# Patient Record
Sex: Female | Born: 1995 | Hispanic: No | Marital: Single | Smoking: Never smoker
Health system: Southern US, Community
[De-identification: ages and names within clinical notes are randomized; demographics above are authoritative.]

## PROBLEM LIST (undated history)

## (undated) ENCOUNTER — Inpatient Hospital Stay (HOSPITAL_COMMUNITY): Payer: Self-pay

## (undated) DIAGNOSIS — Z91410 Personal history of adult physical and sexual abuse: Secondary | ICD-10-CM

## (undated) DIAGNOSIS — R112 Nausea with vomiting, unspecified: Secondary | ICD-10-CM

## (undated) DIAGNOSIS — A609 Anogenital herpesviral infection, unspecified: Secondary | ICD-10-CM

## (undated) DIAGNOSIS — Z62810 Personal history of physical and sexual abuse in childhood: Secondary | ICD-10-CM

## (undated) DIAGNOSIS — F410 Panic disorder [episodic paroxysmal anxiety] without agoraphobia: Secondary | ICD-10-CM

## (undated) DIAGNOSIS — Z9889 Other specified postprocedural states: Secondary | ICD-10-CM

## (undated) HISTORY — PX: TONSILLECTOMY: SUR1361

## (undated) HISTORY — PX: APPENDECTOMY: SHX54

---

## 2015-07-29 ENCOUNTER — Encounter (HOSPITAL_COMMUNITY): Payer: Self-pay | Admitting: Emergency Medicine

## 2015-07-29 ENCOUNTER — Emergency Department (HOSPITAL_COMMUNITY)
Admission: EM | Admit: 2015-07-29 | Discharge: 2015-07-29 | Disposition: A | Discharge status: Home / Self Care | Payer: Medicaid Other | Attending: Emergency Medicine | Admitting: Emergency Medicine

## 2015-07-29 DIAGNOSIS — F10920 Alcohol use, unspecified with intoxication, uncomplicated: Secondary | ICD-10-CM

## 2015-07-29 DIAGNOSIS — F10120 Alcohol abuse with intoxication, uncomplicated: Secondary | ICD-10-CM | POA: Diagnosis not present

## 2015-07-29 DIAGNOSIS — Z87891 Personal history of nicotine dependence: Secondary | ICD-10-CM | POA: Insufficient documentation

## 2015-07-29 DIAGNOSIS — F10129 Alcohol abuse with intoxication, unspecified: Secondary | ICD-10-CM | POA: Diagnosis present

## 2015-07-29 LAB — COMPREHENSIVE METABOLIC PANEL
ALT: 19 U/L (ref 14–54)
ANION GAP: 14 (ref 5–15)
AST: 24 U/L (ref 15–41)
Albumin: 4.9 g/dL (ref 3.5–5.0)
Alkaline Phosphatase: 86 U/L (ref 38–126)
BILIRUBIN TOTAL: 0.9 mg/dL (ref 0.3–1.2)
BUN: 6 mg/dL (ref 6–20)
CHLORIDE: 108 mmol/L (ref 101–111)
CO2: 20 mmol/L — ABNORMAL LOW (ref 22–32)
Calcium: 9.5 mg/dL (ref 8.9–10.3)
Creatinine, Ser: 0.84 mg/dL (ref 0.44–1.00)
GFR calc Af Amer: >60 mL/min (ref >60)
Glucose, Bld: 96 mg/dL (ref 65–99)
POTASSIUM: 3.2 mmol/L — AB (ref 3.5–5.1)
Sodium: 142 mmol/L (ref 135–145)
TOTAL PROTEIN: 7.1 g/dL (ref 6.5–8.1)

## 2015-07-29 LAB — CBC
HEMATOCRIT: 42 % (ref 36.0–46.0)
HEMOGLOBIN: 14.7 g/dL (ref 12.0–15.0)
MCH: 30.6 pg (ref 26.0–34.0)
MCHC: 35 g/dL (ref 30.0–36.0)
MCV: 87.3 fL (ref 78.0–100.0)
Platelets: 323 10*3/uL (ref 150–400)
RBC: 4.81 MIL/uL (ref 3.87–5.11)
RDW: 12.1 % (ref 11.5–15.5)
WBC: 9.6 10*3/uL (ref 4.0–10.5)

## 2015-07-29 LAB — ETHANOL: ALCOHOL ETHYL (B): 225 mg/dL — AB (ref <5)

## 2015-07-29 LAB — I-STAT BETA HCG BLOOD, ED (MC, WL, AP ONLY)

## 2015-07-29 MED ORDER — ONDANSETRON HCL 4 MG/2ML IJ SOLN
4.0000 mg | Freq: Once | INTRAMUSCULAR | Status: AC
  Administered 2015-07-29: 4 mg via INTRAVENOUS
  Filled 2015-07-29: qty 2

## 2015-07-29 MED ORDER — LORAZEPAM 2 MG/ML IJ SOLN
0.5000 mg | Freq: Once | INTRAMUSCULAR | Status: AC
  Administered 2015-07-29: 0.5 mg via INTRAVENOUS
  Filled 2015-07-29: qty 1

## 2015-07-29 MED ORDER — ONDANSETRON 4 MG PO TBDP: 4.0000 mg | ORAL_TABLET | Freq: Three times a day (TID) | ORAL | Status: DC | PRN

## 2015-07-29 MED ORDER — SODIUM CHLORIDE 0.9 % IV BOLUS (SEPSIS)
1000.0000 mL | Freq: Once | INTRAVENOUS | Status: AC
Start: 2015-07-29 — End: 2015-07-29
  Administered 2015-07-29: 1000 mL via INTRAVENOUS
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
## 2015-07-29 NOTE — ED Notes (Signed)
Pt not alert enough for Fluid challenge.

## 2015-07-29 NOTE — ED Notes (Signed)
Ativan 1.5mg  wasted with Madalyn RobJennifer Gloster RN in sharps container.

## 2015-07-29 NOTE — ED Notes (Signed)
Pt awake at this time. Pt is a&o X4. Warm blanket provided, MD aware. Pt to call ride for discharge.

## 2015-07-29 NOTE — ED Notes (Signed)
IV not patent, has been removed by patient. Pt somewhat alert at this time. Unable to converse with patient at this time.

## 2015-07-29 NOTE — ED Provider Notes (Signed)
TIME SEEN: 5:45 AM  CHIEF COMPLAINT: Alcohol intoxication  HPI: Patient is a 20 year old female with no significant past medical history who presents the emergency department for intoxication. Her friend at bedside states that they could not get her to stop "dry heaving" and that is why they brought her to the hospital. Patient is upset, crying, hyperventilating. They report that she had multiple shots of tequila tonight. No drug use. No sign or history of trauma. Patient denies any pain currently.  ROS: See HPI Constitutional: no fever  Eyes: no drainage  ENT: no runny nose   Cardiovascular:  no chest pain  Resp: no SOB  GI: no vomiting GU: no dysuria Integumentary: no rash  Allergy: no hives  Musculoskeletal: no leg swelling  Neurological: no slurred speech ROS otherwise negative  PAST MEDICAL HISTORY/PAST SURGICAL HISTORY:  History reviewed. No pertinent past medical history.  MEDICATIONS:  Prior to Admission medications   Not on File    ALLERGIES:  No Known Allergies  SOCIAL HISTORY:  Social History  Substance Use Topics  . Smoking status: Former Games developermoker  . Smokeless tobacco: Not on file  . Alcohol Use: Yes    FAMILY HISTORY: No family history on file.  EXAM: BP 103/81 mmHg  Pulse 107  Temp(Src) 97.5 F (36.4 C) (Oral)  Ht 5\' 4"  (1.626 m)  Wt 110 lb (49.896 kg)  BMI 18.87 kg/m2  SpO2 100% CONSTITUTIONAL: Alert and oriented and responds appropriately to questions. Patient is disheveled, tearful, hyperventilating, appears intoxicated HEAD: Normocephalic, atraumatic EYES: Conjunctivae clear, PERRL ENT: normal nose; no rhinorrhea; moist mucous membranes NECK: Supple, no meningismus, no LAD  CARD: RRR; S1 and S2 appreciated; no murmurs, no clicks, no rubs, no gallops RESP: Normal chest excursion without splinting or tachypnea; breath sounds clear and equal bilaterally; no wheezes, no rhonchi, no rales, no hypoxia or respiratory distress, speaking full  sentences ABD/GI: Normal bowel sounds; non-distended; soft, non-tender, no rebound, no guarding, no peritoneal signs BACK:  The back appears normal and is non-tender to palpation, there is no CVA tenderness EXT: Normal ROM in all joints; non-tender to palpation; no edema; normal capillary refill; no cyanosis, no calf tenderness or swelling    SKIN: Normal color for age and race; warm; no rash NEURO: Moves all extremities equally, sensation to light touch intact diffusely, cranial nerves II through XII intact PSYCH: Patient is anxious, hyperventilating  MEDICAL DECISION MAKING: Patient here with alcohol intoxication. Labs ordered in triage are pending. Hemodynamically stable. We'll give Ativan for her anxiety. We'll give IV fluids, Zofran.  ED PROGRESS: Patient will need to sober up prior to being discharged home. Discussed this with her friend at bedside. We'll fluid challenge patient, ambulate. When she is clinically sober, will discharge home with sober driver. We'll discharge with prescription for Zofran to take as needed.   At this time, I do not feel there is any life-threatening condition present. I have reviewed and discussed all results (EKG, imaging, lab, urine as appropriate), exam findings with patient. I have reviewed nursing notes and appropriate previous records.  I feel the patient is safe to be discharged home without further emergent workup. Discussed usual and customary return precautions. Patient and family (if present) verbalize understanding and are comfortable with this plan.  Patient will follow-up with their primary care provider. If they do not have a primary care provider, information for follow-up has been provided to them. All questions have been answered.   '  Gladie Gravette N Johnatan Baskette, DO 07/29/15  0721 

## 2015-07-29 NOTE — Discharge Instructions (Signed)
Alcohol Intoxication Alcohol intoxication occurs when the amount of alcohol that a person has consumed impairs his or her ability to mentally and physically function. Alcohol directly impairs the normal chemical activity of the brain. Drinking large amounts of alcohol can lead to changes in mental function and behavior, and it can cause many physical effects that can be harmful.  Alcohol intoxication can range in severity from mild to very severe. Various factors can affect the level of intoxication that occurs, such as the person's age, gender, weight, frequency of alcohol consumption, and the presence of other medical conditions (such as diabetes, seizures, or heart conditions). Dangerous levels of alcohol intoxication may occur when people drink large amounts of alcohol in a short period (binge drinking). Alcohol can also be especially dangerous when combined with certain prescription medicines or "recreational" drugs. SIGNS AND SYMPTOMS Some common signs and symptoms of mild alcohol intoxication include:  Loss of coordination.  Changes in mood and behavior.  Impaired judgment.  Slurred speech. As alcohol intoxication progresses to more severe levels, other signs and symptoms will appear. These may include:  Vomiting.  Confusion and impaired memory.  Slowed breathing.  Seizures.  Loss of consciousness. DIAGNOSIS  Your health care provider will take a medical history and perform a physical exam. You will be asked about the amount and type of alcohol you have consumed. Blood tests will be done to measure the concentration of alcohol in your blood. In many places, your blood alcohol level must be lower than 80 mg/dL (0.86%0.08%) to legally drive. However, many dangerous effects of alcohol can occur at much lower levels.  TREATMENT  People with alcohol intoxication often do not require treatment. Most of the effects of alcohol intoxication are temporary, and they go away as the alcohol naturally  leaves the body. Your health care provider will monitor your condition until you are stable enough to go home. Fluids are sometimes given through an IV access tube to help prevent dehydration.  HOME CARE INSTRUCTIONS  Do not drive after drinking alcohol.  Stay hydrated. Drink enough water and fluids to keep your urine clear or pale yellow. Avoid caffeine.   Only take over-the-counter or prescription medicines as directed by your health care provider.  SEEK MEDICAL CARE IF:   You have persistent vomiting.   You do not feel better after a few days.  You have frequent alcohol intoxication. Your health care provider can help determine if you should see a substance use treatment counselor. SEEK IMMEDIATE MEDICAL CARE IF:   You become shaky or tremble when you try to stop drinking.   You shake uncontrollably (seizure).   You throw up (vomit) blood. This may be bright red or may look like black coffee grounds.   You have blood in your stool. This may be bright red or may appear as a black, tarry, bad smelling stool.   You become lightheaded or faint.  MAKE SURE YOU:   Understand these instructions.  Will watch your condition.  Will get help right away if you are not doing well or get worse.   This information is not intended to replace advice given to you by your health care provider. Make sure you discuss any questions you have with your health care provider.   Document Released: 10/15/2004 Document Revised: 09/07/2012 Document Reviewed: 06/10/2012 Elsevier Interactive Patient Education [employer]2016 Elsevier Inc.    To find a primary care or specialty doctor please call [phone] or [phone] to access "[facility] Find a  Doctor Service."  You may also go on the [facility] website at [email or web address]  There are also multiple [facility] and [facility] practices throughout the [place] that are frequently accepting new patients. You may find a clinic  that is close to your home and contact them.  [facility] -  [address] [place] [address] [phone]  [facility] in [place] (also locations in [place] and [place]) -  [address] [place] [address] [phone]  [facility] -  [address] [place] [address] [phone]

## 2015-07-29 NOTE — ED Notes (Signed)
Pt able to ambulate around room without difficulty.

## 2015-07-29 NOTE — ED Provider Notes (Signed)
11:08 AM Patient awake and alert, no new complaints. Patient voices embarrassment about her presentation last night. Patient is awaiting a ride home.  Rebecca Munchobert Demonte Dobratz, MD 07/29/15 1108

## 2016-01-20 HISTORY — PX: APPENDECTOMY: SHX54

## 2017-07-17 ENCOUNTER — Encounter (HOSPITAL_COMMUNITY): Payer: Self-pay

## 2017-08-12 LAB — OB RESULTS CONSOLE HEPATITIS B SURFACE ANTIGEN: Hepatitis B Surface Ag: NEGATIVE

## 2017-08-12 LAB — OB RESULTS CONSOLE ABO/RH: RH Type: POSITIVE

## 2017-08-12 LAB — OB RESULTS CONSOLE ANTIBODY SCREEN: Antibody Screen: NEGATIVE

## 2017-08-12 LAB — OB RESULTS CONSOLE RPR: RPR: NONREACTIVE

## 2017-08-12 LAB — OB RESULTS CONSOLE RUBELLA ANTIBODY, IGM: Rubella: IMMUNE

## 2017-08-12 LAB — OB RESULTS CONSOLE HIV ANTIBODY (ROUTINE TESTING): HIV: NONREACTIVE

## 2017-08-12 LAB — OB RESULTS CONSOLE GC/CHLAMYDIA: Gonorrhea: NEGATIVE

## 2017-08-13 ENCOUNTER — Other Ambulatory Visit (HOSPITAL_COMMUNITY): Payer: Self-pay | Admitting: Nurse Practitioner

## 2017-08-13 ENCOUNTER — Encounter (HOSPITAL_COMMUNITY): Payer: Self-pay

## 2017-08-13 DIAGNOSIS — Z3682 Encounter for antenatal screening for nuchal translucency: Secondary | ICD-10-CM

## 2017-08-13 DIAGNOSIS — Z3A13 13 weeks gestation of pregnancy: Secondary | ICD-10-CM

## 2017-08-18 ENCOUNTER — Encounter (HOSPITAL_COMMUNITY): Payer: Self-pay | Admitting: *Deleted

## 2017-08-20 ENCOUNTER — Ambulatory Visit (HOSPITAL_COMMUNITY)
Admission: RE | Admit: 2017-08-20 | Discharge: 2017-08-20 | Disposition: A | Discharge status: Home / Self Care | Payer: Medicaid Other | Source: Ambulatory Visit | Attending: Nurse Practitioner | Admitting: Nurse Practitioner

## 2017-08-20 ENCOUNTER — Encounter (HOSPITAL_COMMUNITY): Payer: Self-pay

## 2017-08-20 ENCOUNTER — Other Ambulatory Visit: Payer: Self-pay

## 2017-08-20 DIAGNOSIS — Z3A13 13 weeks gestation of pregnancy: Secondary | ICD-10-CM | POA: Insufficient documentation

## 2017-08-20 DIAGNOSIS — Z3682 Encounter for antenatal screening for nuchal translucency: Secondary | ICD-10-CM | POA: Insufficient documentation

## 2017-08-20 HISTORY — DX: Anogenital herpesviral infection, unspecified: A60.9

## 2017-08-20 HISTORY — DX: Personal history of adult physical and sexual abuse: Z91.410

## 2017-08-20 HISTORY — DX: Panic disorder (episodic paroxysmal anxiety): F41.0

## 2017-08-20 HISTORY — DX: Personal history of physical and sexual abuse in childhood: Z62.810

## 2017-09-01 ENCOUNTER — Other Ambulatory Visit: Payer: Self-pay

## 2017-09-09 LAB — OB RESULTS CONSOLE GBS: STREP GROUP B AG: POSITIVE

## 2017-09-16 ENCOUNTER — Other Ambulatory Visit: Payer: Self-pay | Admitting: Family Medicine

## 2017-09-16 DIAGNOSIS — O289 Unspecified abnormal findings on antenatal screening of mother: Secondary | ICD-10-CM

## 2017-10-01 ENCOUNTER — Other Ambulatory Visit: Payer: Self-pay

## 2017-10-01 ENCOUNTER — Ambulatory Visit (HOSPITAL_COMMUNITY)
Admission: RE | Admit: 2017-10-01 | Discharge: 2017-10-01 | Disposition: A | Discharge status: Home / Self Care | Payer: Medicaid Other | Source: Ambulatory Visit | Attending: Family Medicine | Admitting: Family Medicine

## 2017-10-01 ENCOUNTER — Encounter (HOSPITAL_COMMUNITY): Payer: Self-pay

## 2017-10-01 DIAGNOSIS — O289 Unspecified abnormal findings on antenatal screening of mother: Secondary | ICD-10-CM | POA: Diagnosis not present

## 2017-10-01 DIAGNOSIS — Z363 Encounter for antenatal screening for malformations: Secondary | ICD-10-CM | POA: Diagnosis present

## 2017-10-01 DIAGNOSIS — O99322 Drug use complicating pregnancy, second trimester: Secondary | ICD-10-CM | POA: Diagnosis not present

## 2017-10-01 DIAGNOSIS — F1210 Cannabis abuse, uncomplicated: Secondary | ICD-10-CM | POA: Diagnosis not present

## 2017-10-01 DIAGNOSIS — Z3A19 19 weeks gestation of pregnancy: Secondary | ICD-10-CM | POA: Diagnosis not present

## 2017-10-01 DIAGNOSIS — O99312 Alcohol use complicating pregnancy, second trimester: Secondary | ICD-10-CM | POA: Diagnosis not present

## 2017-10-04 ENCOUNTER — Other Ambulatory Visit (HOSPITAL_COMMUNITY): Payer: Self-pay | Admitting: *Deleted

## 2017-10-04 DIAGNOSIS — Z362 Encounter for other antenatal screening follow-up: Secondary | ICD-10-CM

## 2017-10-29 ENCOUNTER — Ambulatory Visit (HOSPITAL_COMMUNITY)
Admission: RE | Admit: 2017-10-29 | Discharge: 2017-10-29 | Disposition: A | Discharge status: Home / Self Care | Payer: Medicaid Other | Source: Ambulatory Visit | Attending: Family Medicine | Admitting: Family Medicine

## 2017-10-29 ENCOUNTER — Encounter (HOSPITAL_COMMUNITY): Payer: Self-pay

## 2017-10-29 DIAGNOSIS — O99312 Alcohol use complicating pregnancy, second trimester: Secondary | ICD-10-CM | POA: Diagnosis present

## 2017-10-29 DIAGNOSIS — Z362 Encounter for other antenatal screening follow-up: Secondary | ICD-10-CM | POA: Diagnosis not present

## 2017-10-29 DIAGNOSIS — O289 Unspecified abnormal findings on antenatal screening of mother: Secondary | ICD-10-CM | POA: Insufficient documentation

## 2017-10-29 DIAGNOSIS — Z3A23 23 weeks gestation of pregnancy: Secondary | ICD-10-CM | POA: Diagnosis not present

## 2017-11-01 ENCOUNTER — Other Ambulatory Visit (HOSPITAL_COMMUNITY): Payer: Self-pay | Admitting: *Deleted

## 2017-11-01 DIAGNOSIS — R772 Abnormality of alphafetoprotein: Secondary | ICD-10-CM

## 2017-12-10 ENCOUNTER — Ambulatory Visit (HOSPITAL_COMMUNITY)
Admission: RE | Admit: 2017-12-10 | Discharge: 2017-12-10 | Disposition: A | Discharge status: Home / Self Care | Payer: Medicaid Other | Source: Ambulatory Visit | Attending: Nurse Practitioner | Admitting: Nurse Practitioner

## 2017-12-10 ENCOUNTER — Encounter (HOSPITAL_COMMUNITY): Payer: Self-pay

## 2017-12-10 DIAGNOSIS — R772 Abnormality of alphafetoprotein: Secondary | ICD-10-CM | POA: Insufficient documentation

## 2017-12-10 DIAGNOSIS — O289 Unspecified abnormal findings on antenatal screening of mother: Secondary | ICD-10-CM | POA: Diagnosis not present

## 2017-12-10 DIAGNOSIS — Z362 Encounter for other antenatal screening follow-up: Secondary | ICD-10-CM | POA: Insufficient documentation

## 2017-12-10 DIAGNOSIS — O99312 Alcohol use complicating pregnancy, second trimester: Secondary | ICD-10-CM | POA: Diagnosis not present

## 2017-12-10 DIAGNOSIS — Z3A29 29 weeks gestation of pregnancy: Secondary | ICD-10-CM | POA: Diagnosis not present

## 2017-12-13 ENCOUNTER — Other Ambulatory Visit (HOSPITAL_COMMUNITY): Payer: Self-pay | Admitting: *Deleted

## 2017-12-13 DIAGNOSIS — Z362 Encounter for other antenatal screening follow-up: Secondary | ICD-10-CM

## 2018-01-14 ENCOUNTER — Ambulatory Visit (HOSPITAL_COMMUNITY)
Admission: RE | Admit: 2018-01-14 | Discharge: 2018-01-14 | Disposition: A | Discharge status: Home / Self Care | Payer: Medicaid Other | Source: Ambulatory Visit | Attending: Nurse Practitioner | Admitting: Nurse Practitioner

## 2018-01-14 ENCOUNTER — Encounter (HOSPITAL_COMMUNITY): Payer: Self-pay

## 2018-01-14 DIAGNOSIS — O99313 Alcohol use complicating pregnancy, third trimester: Secondary | ICD-10-CM

## 2018-01-14 DIAGNOSIS — Z3A34 34 weeks gestation of pregnancy: Secondary | ICD-10-CM | POA: Diagnosis present

## 2018-01-14 DIAGNOSIS — Z362 Encounter for other antenatal screening follow-up: Secondary | ICD-10-CM | POA: Diagnosis not present

## 2018-01-14 DIAGNOSIS — O289 Unspecified abnormal findings on antenatal screening of mother: Secondary | ICD-10-CM

## 2018-01-19 NOTE — L&D Delivery Note (Signed)
OB/GYN Faculty Practice Delivery Note  Rebecca Lucero is a 23 y.o. G2P0010 s/p SVD at [redacted]w[redacted]d. She was admitted for early labor.   ROM: 4h 61m with meconium-stained fluid GBS Status: positive - received 4 doses of penicillin Maximum Maternal Temperature: Temp (48hrs), Avg:98.4 F (36.9 C), Min:97.7 F (36.5 C), Max:99 F (37.2 C)  Labor Progress: . Admitted in early labor . Epidural placed . Augmentation with pitocin around 0630 . SROM light meconium   Delivery Date/Time: 02/20/18 at 1722 Delivery: Called to room and patient was complete and pushing. Head delivered OA. No nuchal cord present. Shoulder and body delivered in usual fashion. Infant with spontaneous cry, placed on mother's abdomen, dried and stimulated. Cord clamped x 2 after 1-minute delay, and cut by father of baby. Cord blood drawn. Placenta delivered spontaneously with gentle cord traction. Fundus firm with massage and Pitocin. Labia, perineum, vagina, and cervix inspected inspected with bilateral periurethral lacerations.   Placenta: spontaneous, intact, 3-vessel cord Complications: none Lacerations: bilateral periurethral lacerations repaired with 4-0 Monocryl EBL: 484cc Analgesia: epidural  Postpartum Planning [n/a] message to sent to schedule follow-up  [x]  vaccines UTD  Infant: Vigorous female  APGARs 8, 84  42g  Laurel S. Earlene Plater, DO OB/GYN Fellow, Faculty Practice

## 2018-02-19 ENCOUNTER — Inpatient Hospital Stay (HOSPITAL_COMMUNITY)
Admission: AD | Admit: 2018-02-19 | Discharge: 2018-02-22 | DRG: 806 | Disposition: A | Discharge status: Home / Self Care | Payer: Medicaid Other | Attending: Obstetrics & Gynecology | Admitting: Obstetrics & Gynecology

## 2018-02-19 ENCOUNTER — Encounter (HOSPITAL_COMMUNITY): Payer: Self-pay

## 2018-02-19 ENCOUNTER — Encounter (HOSPITAL_COMMUNITY): Payer: Self-pay | Admitting: Emergency Medicine

## 2018-02-19 ENCOUNTER — Other Ambulatory Visit: Payer: Self-pay

## 2018-02-19 ENCOUNTER — Inpatient Hospital Stay (HOSPITAL_COMMUNITY)
Admission: AD | Admit: 2018-02-19 | Discharge: 2018-02-19 | Disposition: A | Discharge status: Home / Self Care | Payer: Medicaid Other | Source: Ambulatory Visit | Attending: Obstetrics and Gynecology | Admitting: Obstetrics and Gynecology

## 2018-02-19 DIAGNOSIS — F419 Anxiety disorder, unspecified: Secondary | ICD-10-CM | POA: Diagnosis present

## 2018-02-19 DIAGNOSIS — O9832 Other infections with a predominantly sexual mode of transmission complicating childbirth: Secondary | ICD-10-CM | POA: Diagnosis present

## 2018-02-19 DIAGNOSIS — Z3A39 39 weeks gestation of pregnancy: Secondary | ICD-10-CM | POA: Insufficient documentation

## 2018-02-19 DIAGNOSIS — O471 False labor at or after 37 completed weeks of gestation: Secondary | ICD-10-CM

## 2018-02-19 DIAGNOSIS — A6000 Herpesviral infection of urogenital system, unspecified: Secondary | ICD-10-CM | POA: Diagnosis present

## 2018-02-19 DIAGNOSIS — Z87891 Personal history of nicotine dependence: Secondary | ICD-10-CM

## 2018-02-19 DIAGNOSIS — O99824 Streptococcus B carrier state complicating childbirth: Principal | ICD-10-CM | POA: Diagnosis present

## 2018-02-19 LAB — URINALYSIS, ROUTINE W REFLEX MICROSCOPIC
Bilirubin Urine: NEGATIVE
GLUCOSE, UA: NEGATIVE mg/dL
Ketones, ur: NEGATIVE mg/dL
Leukocytes, UA: NEGATIVE
Nitrite: NEGATIVE
Protein, ur: NEGATIVE mg/dL
Specific Gravity, Urine: 1.01 (ref 1.005–1.030)
pH: 6.5 (ref 5.0–8.0)

## 2018-02-19 LAB — URINALYSIS, MICROSCOPIC (REFLEX)

## 2018-02-19 NOTE — MAU Note (Signed)
Pt returned back to MAU after being gone for abt an hour says she couldn't make it home her ctx are too bad. Denies bleeding, LOF. +FM

## 2018-02-19 NOTE — MAU Note (Signed)
I have communicated with Dr. Aneta MinsPhillip and reviewed vital signs:  Vitals:   02/19/18 1924 02/19/18 2141  BP: 132/85 128/81  Pulse: 95 (!) 102  Resp: 18 18  Temp: 97.8 F (36.6 C)   SpO2:  100%    Vaginal exam:  Dilation: 3 Effacement (%): 80 Cervical Position: Posterior Station: 0 Presentation: Vertex Exam by:: Adah Perlhandra Tammra Pressman RN,   Also reviewed contraction pattern and that non-stress test is reactive.  It has been documented that patient is contracting every 3-7 minutes with minimal cervical change over one hour not indicating active labor.  Patient denies any other complaints.  Based on this report provider has given order for discharge.  A discharge order and diagnosis entered by a provider.   Labor discharge instructions reviewed with patient.

## 2018-02-19 NOTE — MAU Note (Signed)
Ctxs since last night. This afternoon saw some bloody mucous so thought should come in. Ctxs more regular since 1700. Good FM

## 2018-02-20 ENCOUNTER — Encounter (HOSPITAL_COMMUNITY): Payer: Self-pay

## 2018-02-20 ENCOUNTER — Other Ambulatory Visit: Payer: Self-pay

## 2018-02-20 ENCOUNTER — Inpatient Hospital Stay (HOSPITAL_COMMUNITY): Payer: Medicaid Other | Admitting: Anesthesiology

## 2018-02-20 DIAGNOSIS — O99824 Streptococcus B carrier state complicating childbirth: Secondary | ICD-10-CM

## 2018-02-20 DIAGNOSIS — O9832 Other infections with a predominantly sexual mode of transmission complicating childbirth: Secondary | ICD-10-CM | POA: Diagnosis present

## 2018-02-20 DIAGNOSIS — A6000 Herpesviral infection of urogenital system, unspecified: Secondary | ICD-10-CM | POA: Diagnosis present

## 2018-02-20 DIAGNOSIS — Z3483 Encounter for supervision of other normal pregnancy, third trimester: Secondary | ICD-10-CM | POA: Diagnosis present

## 2018-02-20 DIAGNOSIS — Z3A39 39 weeks gestation of pregnancy: Secondary | ICD-10-CM

## 2018-02-20 DIAGNOSIS — Z87891 Personal history of nicotine dependence: Secondary | ICD-10-CM | POA: Diagnosis not present

## 2018-02-20 DIAGNOSIS — F419 Anxiety disorder, unspecified: Secondary | ICD-10-CM | POA: Diagnosis present

## 2018-02-20 LAB — CBC
HCT: 40.1 % (ref 36.0–46.0)
Hemoglobin: 14 g/dL (ref 12.0–15.0)
MCH: 31.5 pg (ref 26.0–34.0)
MCHC: 34.9 g/dL (ref 30.0–36.0)
MCV: 90.1 fL (ref 80.0–100.0)
Platelets: 234 10*3/uL (ref 150–400)
RBC: 4.45 MIL/uL (ref 3.87–5.11)
RDW: 12.9 % (ref 11.5–15.5)
WBC: 14 10*3/uL — AB (ref 4.0–10.5)
nRBC: 0 % (ref 0.0–0.2)

## 2018-02-20 LAB — RPR: RPR Ser Ql: NONREACTIVE

## 2018-02-20 LAB — TYPE AND SCREEN
ABO/RH(D): O POS
Antibody Screen: NEGATIVE

## 2018-02-20 LAB — ABO/RH: ABO/RH(D): O POS

## 2018-02-20 MED ORDER — FENTANYL 2.5 MCG/ML BUPIVACAINE 1/10 % EPIDURAL INFUSION (WH - ANES)
14.0000 mL/h | INTRAMUSCULAR | Status: DC | PRN
  Administered 2018-02-20 (×2): 14 mL/h via EPIDURAL
  Filled 2018-02-20 (×2): qty 100

## 2018-02-20 MED ORDER — LIDOCAINE HCL (PF) 1 % IJ SOLN: 30.0000 mL | INTRAMUSCULAR | Status: DC | PRN

## 2018-02-20 MED ORDER — SOD CITRATE-CITRIC ACID 500-334 MG/5ML PO SOLN: 30.0000 mL | ORAL | Status: DC | PRN

## 2018-02-20 MED ORDER — DIBUCAINE 1 % RE OINT
1.0000 "application " | TOPICAL_OINTMENT | RECTAL | Status: DC | PRN
  Filled 2018-02-20: qty 28

## 2018-02-20 MED ORDER — EPHEDRINE 5 MG/ML INJ: 10.0000 mg | INTRAVENOUS | Status: DC | PRN

## 2018-02-20 MED ORDER — ONDANSETRON HCL 4 MG/2ML IJ SOLN: 4.0000 mg | INTRAMUSCULAR | Status: DC | PRN

## 2018-02-20 MED ORDER — MEASLES, MUMPS & RUBELLA VAC IJ SOLR
0.5000 mL | Freq: Once | INTRAMUSCULAR | Status: DC
  Filled 2018-02-20: qty 0.5

## 2018-02-20 MED ORDER — IBUPROFEN 600 MG PO TABS
600.0000 mg | ORAL_TABLET | Freq: Four times a day (QID) | ORAL | Status: DC
  Administered 2018-02-20 – 2018-02-22 (×6): 600 mg via ORAL
  Filled 2018-02-20 (×6): qty 1

## 2018-02-20 MED ORDER — ACETAMINOPHEN 325 MG PO TABS: 650.0000 mg | ORAL_TABLET | ORAL | Status: DC | PRN

## 2018-02-20 MED ORDER — OXYTOCIN 40 UNITS IN NORMAL SALINE INFUSION - SIMPLE MED
1.0000 m[IU]/min | INTRAVENOUS | Status: DC
  Administered 2018-02-20: 2 m[IU]/min via INTRAVENOUS

## 2018-02-20 MED ORDER — FENTANYL CITRATE (PF) 100 MCG/2ML IJ SOLN
100.0000 ug | INTRAMUSCULAR | Status: DC | PRN
  Administered 2018-02-20: 100 ug via INTRAVENOUS
  Filled 2018-02-20: qty 2

## 2018-02-20 MED ORDER — ONDANSETRON HCL 4 MG PO TABS
4.0000 mg | ORAL_TABLET | ORAL | Status: DC | PRN
  Filled 2018-02-20: qty 1

## 2018-02-20 MED ORDER — TERBUTALINE SULFATE 1 MG/ML IJ SOLN: 0.2500 mg | Freq: Once | INTRAMUSCULAR | Status: DC | PRN

## 2018-02-20 MED ORDER — PENICILLIN G 3 MILLION UNITS IVPB - SIMPLE MED
3.0000 10*6.[IU] | INTRAVENOUS | Status: DC
  Administered 2018-02-20 (×3): 3 10*6.[IU] via INTRAVENOUS
  Filled 2018-02-20: qty 100

## 2018-02-20 MED ORDER — ONDANSETRON HCL 4 MG/2ML IJ SOLN
4.0000 mg | Freq: Four times a day (QID) | INTRAMUSCULAR | Status: DC | PRN
  Administered 2018-02-20: 4 mg via INTRAVENOUS
  Filled 2018-02-20: qty 2

## 2018-02-20 MED ORDER — SODIUM CHLORIDE 0.9 % IV SOLN
5.0000 10*6.[IU] | Freq: Once | INTRAVENOUS | Status: AC
  Administered 2018-02-20: 5 10*6.[IU] via INTRAVENOUS
  Filled 2018-02-20: qty 5

## 2018-02-20 MED ORDER — DIPHENHYDRAMINE HCL 50 MG/ML IJ SOLN: 12.5000 mg | INTRAMUSCULAR | Status: DC | PRN

## 2018-02-20 MED ORDER — PHENYLEPHRINE 40 MCG/ML (10ML) SYRINGE FOR IV PUSH (FOR BLOOD PRESSURE SUPPORT): 80.0000 ug | PREFILLED_SYRINGE | INTRAVENOUS | Status: DC | PRN

## 2018-02-20 MED ORDER — COCONUT OIL OIL
1.0000 "application " | TOPICAL_OIL | Status: DC | PRN
  Administered 2018-02-22: 1 via TOPICAL
  Filled 2018-02-20 (×2): qty 120

## 2018-02-20 MED ORDER — PRENATAL MULTIVITAMIN CH
1.0000 | ORAL_TABLET | Freq: Every day | ORAL | Status: DC
  Administered 2018-02-21: 1 via ORAL
  Filled 2018-02-20: qty 1

## 2018-02-20 MED ORDER — PHENYLEPHRINE 40 MCG/ML (10ML) SYRINGE FOR IV PUSH (FOR BLOOD PRESSURE SUPPORT)
80.0000 ug | PREFILLED_SYRINGE | INTRAVENOUS | Status: DC | PRN
  Filled 2018-02-20: qty 10

## 2018-02-20 MED ORDER — ACETAMINOPHEN 325 MG PO TABS
650.0000 mg | ORAL_TABLET | ORAL | Status: DC | PRN
  Administered 2018-02-21 (×3): 650 mg via ORAL
  Filled 2018-02-20 (×3): qty 2

## 2018-02-20 MED ORDER — WITCH HAZEL-GLYCERIN EX PADS: 1.0000 "application " | MEDICATED_PAD | CUTANEOUS | Status: DC | PRN

## 2018-02-20 MED ORDER — TETANUS-DIPHTH-ACELL PERTUSSIS 5-2.5-18.5 LF-MCG/0.5 IM SUSP
0.5000 mL | Freq: Once | INTRAMUSCULAR | Status: DC
  Filled 2018-02-20: qty 0.5

## 2018-02-20 MED ORDER — OXYTOCIN BOLUS FROM INFUSION
500.0000 mL | Freq: Once | INTRAVENOUS | Status: AC
  Administered 2018-02-20: 500 mL via INTRAVENOUS

## 2018-02-20 MED ORDER — SIMETHICONE 80 MG PO CHEW: 80.0000 mg | CHEWABLE_TABLET | ORAL | Status: DC | PRN

## 2018-02-20 MED ORDER — OXYTOCIN 40 UNITS IN NORMAL SALINE INFUSION - SIMPLE MED
2.5000 [IU]/h | INTRAVENOUS | Status: DC
  Filled 2018-02-20: qty 1000

## 2018-02-20 MED ORDER — LACTATED RINGERS IV SOLN
500.0000 mL | INTRAVENOUS | Status: DC | PRN
  Administered 2018-02-20: 1000 mL via INTRAVENOUS

## 2018-02-20 MED ORDER — DIPHENHYDRAMINE HCL 25 MG PO CAPS: 25.0000 mg | ORAL_CAPSULE | Freq: Four times a day (QID) | ORAL | Status: DC | PRN

## 2018-02-20 MED ORDER — LACTATED RINGERS IV SOLN
INTRAVENOUS | Status: DC
  Administered 2018-02-20 (×3): via INTRAVENOUS

## 2018-02-20 MED ORDER — LIDOCAINE HCL (PF) 1 % IJ SOLN
INTRAMUSCULAR | Status: DC | PRN
  Administered 2018-02-20: 5 mL via EPIDURAL

## 2018-02-20 MED ORDER — BENZOCAINE-MENTHOL 20-0.5 % EX AERO
1.0000 "application " | INHALATION_SPRAY | CUTANEOUS | Status: DC | PRN
  Administered 2018-02-21: 1 via TOPICAL
  Filled 2018-02-20 (×2): qty 56

## 2018-02-20 MED ORDER — SENNOSIDES-DOCUSATE SODIUM 8.6-50 MG PO TABS
2.0000 | ORAL_TABLET | ORAL | Status: DC
  Administered 2018-02-21 (×2): 2 via ORAL
  Filled 2018-02-20 (×2): qty 2

## 2018-02-20 MED ORDER — LACTATED RINGERS IV SOLN: 500.0000 mL | Freq: Once | INTRAVENOUS | Status: DC
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
## 2018-02-20 NOTE — Progress Notes (Signed)
OB/GYN Faculty Practice: Labor Progress Note  Subjective: Doing well, comfortable with epidural. Boyfriend in the room. Father also coming to visit. States she's very nervous about pushing, going home.   Objective: BP 107/67   Pulse 90   Temp 98.2 F (36.8 C) (Oral)   Resp 16   Ht 5\' 4"  (1.626 m)   Wt 78.5 kg   LMP 05/25/2017   SpO2 96%   BMI 29.70 kg/m  Gen: comfortable appearing Dilation: 3.5 Effacement (%): 80 Cervical Position: Anterior Station: -2 Presentation: Vertex Exam by:: Ferne Coe RNC  Assessment and Plan: 23 y.o. G2P0010 101w5d here for early labor.   Labor: Admitted overnight in early labor. Contractions had spaced out and comfortable after fentanyl, so pitocin started this morning around 0630 to augment her labor.  -- will consider AROM once closer to active labor -- continue to titrate pitocin per protocol  -- pain control: epidural in place -- PPH Risk: medium  Fetal Well-Being: EFW 6-7lbs per protocol. Cephalic by prior checks.  -- Category I - continuous fetal monitoring  -- GBS positive - PCN    Aizlyn Schifano S. Earlene Plater, DO OB/GYN Fellow, Faculty Practice  9:58 AM

## 2018-02-20 NOTE — Anesthesia Pain Management Evaluation Note (Signed)
  CRNA Pain Management Visit Note  Patient: Rebecca Lucero, 23 y.o., female  "Hello I am a member of the anesthesia team at [facility]. We have an anesthesia team available at all times to provide care throughout the hospital, including epidural management and anesthesia for C-section. I don't know your plan for the delivery whether it a natural birth, water birth, IV sedation, nitrous supplementation, doula or epidural, but we want to meet your pain goals."   1.Was your pain managed to your expectations on prior hospitalizations?   No prior hospitalizations  2.What is your expectation for pain management during this hospitalization?     Epidural  3.How can we help you reach that goal? Epidural in place at time of visit  Record the patient's initial score and the patient's pain goal.   Pain: Patient sleeping - unable to assess  Pain Goal: Patient sleeping - unable to assess [facility] wants you to be able to say your pain was always managed very well.  Rica Records 02/20/2018

## 2018-02-20 NOTE — Anesthesia Procedure Notes (Signed)
Epidural Patient location during procedure: OB Start time: 02/20/2018 7:42 AM End time: 02/20/2018 7:58 AM  Staffing Anesthesiologist: Trevor Iha, MD Performed: anesthesiologist   Preanesthetic Checklist Completed: patient identified, site marked, surgical consent, pre-op evaluation, timeout performed, IV checked, risks and benefits discussed and monitors and equipment checked  Epidural Patient position: sitting Prep: site prepped and draped and DuraPrep Patient monitoring: continuous pulse ox and blood pressure Approach: midline Location: L3-L4 Injection technique: LOR air  Needle:  Needle type: Tuohy  Needle gauge: 17 G Needle length: 9 cm and 9 Needle insertion depth: 6 cm Catheter type: closed end flexible Catheter size: 19 Gauge Catheter at skin depth: 11 cm Test dose: negative  Assessment Events: blood not aspirated, injection not painful, no injection resistance, negative IV test and no paresthesia  Additional Notes Patient identified. Risks/Benefits/Options discussed with patient including but not limited to bleeding, infection, nerve damage, paralysis, failed block, incomplete pain control, headache, blood pressure changes, nausea, vomiting, reactions to medication both or allergic, itching and postpartum back pain. Confirmed with bedside nurse the patient's most recent platelet count. Confirmed with patient that they are not currently taking any anticoagulation, have any bleeding history or any family history of bleeding disorders. Patient expressed understanding and wished to proceed. All questions were answered. Sterile technique was used throughout the entire procedure. Please see nursing notes for vital signs. Test dose was given through epidural needle and negative prior to continuing to dose epidural or start infusion. Warning signs of high block given to the patient including shortness of breath, tingling/numbness in hands, complete motor block, or any concerning  symptoms with instructions to call for help. Patient was given instructions on fall risk and not to get out of bed. All questions and concerns addressed with instructions to call with any issues. 1 Attempt (S) . Patient tolerated procedure well.

## 2018-02-20 NOTE — Progress Notes (Addendum)
OB/GYN Faculty Practice: Labor Progress Note  Subjective: Comfortable, states not feeling any contractions with epidural. Water broke a little while ago. Family still in room. Trying to get some sleep.   Objective: BP (!) 106/53   Pulse 86   Temp 98.8 F (37.1 C) (Oral)   Resp 16   Ht 5\' 4"  (1.626 m)   Wt 78.5 kg   LMP 05/25/2017   SpO2 96%   BMI 29.70 kg/m  Gen: comfortable appearing Dilation: 5 Effacement (%): 90 Cervical Position: Anterior Station: -1 Presentation: Vertex Exam by:: Ferne Coe RNC  Assessment and Plan: 23 y.o. G2P0010 [redacted]w[redacted]d who was admitted in early labor.   Labor: Progressing well with pitocin. Now 5/90 after SROM about an hour ago. Not feeling any pressure. Plan to recheck when pressure or with continued early/variable decelerations.  -- continue to titrate pitocin per protocol  -- pain control: epidural in place -- PPH Risk: medium  Fetal Well-Being: EFW 6-7lbs per protocol. Cephalic by prior checks.  -- Category I - continuous fetal monitoring - having some early decelerations, some variables - trying position changes -- GBS positive - PCN    Rebecca Lemmerman S. Earlene Plater, DO OB/GYN Fellow, Faculty Practice  2:18 PM

## 2018-02-20 NOTE — Progress Notes (Signed)
Resident updated on FHT's, SVE, uterine activity.  Will decrease pitocin by half.  Continue to monitor.
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
## 2018-02-20 NOTE — Progress Notes (Signed)
LABOR PROGRESS NOTE  Rebecca Lucero is a 23 y.o. G2P0010 at [redacted]w[redacted]d  admitted for SOL  Subjective: Feeling comfortable after 1 dose of fentanyl  Objective: BP 122/84   Pulse 97   Temp 97.7 F (36.5 C) (Oral)   Resp 19   LMP 05/25/2017   SpO2 96%  or  Vitals:   02/20/18 0234 02/20/18 0256 02/20/18 0430 02/20/18 0533  BP: 127/78  124/82 122/84  Pulse: 98  88 97  Resp:      Temp:  97.7 F (36.5 C)    TempSrc:  Oral    SpO2:        Dilation: 4 Effacement (%): 80 Cervical Position: Middle Station: -1 Presentation: Vertex Exam by:: Gearldine Bienenstock, RN FHT: baseline rate 125, moderate varibility, + acel, no decel Toco: irregular q2-26m  Labs: Lab Results  Component Value Date   WBC 14.0 (H) 02/20/2018   HGB 14.0 02/20/2018   HCT 40.1 02/20/2018   MCV 90.1 02/20/2018   PLT 234 02/20/2018    Patient Active Problem List   Diagnosis Date Noted  . Normal labor 02/20/2018    Assessment / Plan: 23 y.o. G2P0010 at [redacted]w[redacted]d here for SOL  Labor: cervix unchanged since admission. Start pitocin  Fetal Wellbeing:  Cat 1 Pain Control:  epidural Anticipated MOD:  SVD  Gwenevere Abbot, MD  OB Fellow  02/20/2018, 6:37 AM
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
## 2018-02-21 NOTE — Lactation Note (Signed)
This note was copied from a baby's chart. Lactation Consultation Note  Patient Name: Rebecca Lucero HUDJS'H Date: 02/21/2018 Reason for consult: Initial assessment   P1, 17 hours old.  Baby latched upon entering in cradle hold. Reviewed how to hand express on other breast with good beads of colostrum. Helped baby re-latch for depth in cross cradle hold. Baby eager.  Sucks and swallows observed. Feed on demand approximately 8-12 times per day.   Basics reviewed. Mom made aware of O/P services, breastfeeding support groups, community resources, and our phone # for post-discharge questions.     Maternal Data Has patient been taught Hand Expression?: Yes Does the patient have breastfeeding experience prior to this delivery?: No  Feeding Feeding Type: Breast Fed  LATCH Score Latch: Grasps breast easily, tongue down, lips flanged, rhythmical sucking.  Audible Swallowing: A few with stimulation  Type of Nipple: Everted at rest and after stimulation  Comfort (Breast/Nipple): Soft / non-tender  Hold (Positioning): Assistance needed to correctly position infant at breast and maintain latch.  LATCH Score: 8  Interventions Interventions: Breast feeding basics reviewed;Assisted with latch;Skin to skin;Hand express;Adjust position;Support pillows  Lactation Tools Discussed/Used     Consult Status Consult Status: Follow-up Date: 02/22/18 Follow-up type: In-patient    Dahlia Byes [facility] 02/21/2018, 10:47 AM

## 2018-02-21 NOTE — Anesthesia Postprocedure Evaluation (Signed)
Anesthesia Post Note  Patient: Rebecca Lucero  Procedure(s) Performed: AN AD HOC LABOR EPIDURAL     Patient location during evaluation: Mother Baby Anesthesia Type: Epidural Level of consciousness: awake and alert Pain management: pain level controlled Vital Signs Assessment: post-procedure vital signs reviewed and stable Respiratory status: spontaneous breathing, nonlabored ventilation and respiratory function stable Cardiovascular status: stable Postop Assessment: no headache, no backache and epidural receding Anesthetic complications: no    Last Vitals:  Vitals:   02/21/18 0037 02/21/18 0527  BP: 119/78 104/64  Pulse: 90 75  Resp: 16 16  Temp: 36.8 C 36.5 C  SpO2: 99% 99%    Last Pain:  Vitals:   02/21/18 0528  TempSrc:   PainSc: 3    Pain Goal:                   Junious Silk

## 2018-02-21 NOTE — Progress Notes (Addendum)
G2P1 Post Partum Day #1 s/p SVD following SOL at [redacted]w[redacted]d. Subjective: up ad lib, voiding, tolerating PO and + flatus  Patient reports 6/10 pain from her periurethral lacerations that is worse with ambulation but tolerable with PRN pain meds and laying down. Vaginal bleeding has improved, patient reports using 2 pads overnight.  Objective: Blood pressure 104/64, pulse 75, temperature 97.7 F (36.5 C), temperature source Oral, resp. rate 16, height 5\' 4"  (1.626 m), weight 78.5 kg, last menstrual period 05/25/2017, SpO2 99 %, unknown if currently breastfeeding.  Physical Exam:  General: alert, cooperative and no distress. Seemed a bit uncomfortable laying in bed but has just been ambulating  Lochia: appropriate Uterine Fundus: firm Incision: n/a DVT Evaluation: No evidence of DVT seen on physical exam. No cords or calf tenderness. No significant calf/ankle edema. Cardio: RRR Pulm: CTA  Recent Labs    02/20/18 0155  HGB 14.0  HCT 40.1    Assessment/Plan: Plan for discharge tomorrow  History of anxiety- SW consulted. Has not seen yet. Laceration pain- continue taking PRN ibuprofen for pain and stool softener Positive GBS- adequately treated with PCN (4 doses) MOF: breast Contraception: POPs    LOS: 1 day   Betsy Rosello 02/21/2018, 7:45 AM

## 2018-02-21 NOTE — Progress Notes (Signed)
CSW acknowledges consult and completed clinical assessment.  Clinical documentation will follow.  There are no barriers to d/c.  Mico Spark Boyd-Gilyard, MSW, LCSW Clinical Social Work [phone]   

## 2018-02-22 MED ORDER — SENNOSIDES-DOCUSATE SODIUM 8.6-50 MG PO TABS: 1.0000 | ORAL_TABLET | ORAL | 0 refills | Status: AC

## 2018-02-22 MED ORDER — IBUPROFEN 600 MG PO TABS: 600.0000 mg | ORAL_TABLET | Freq: Four times a day (QID) | ORAL | 0 refills | Status: DC

## 2018-02-22 NOTE — Clinical Social Work Maternal (Signed)
CLINICAL SOCIAL WORK MATERNAL/CHILD NOTE  Patient Details  Name: Rebecca Lucero MRN: [id number] Date of Birth: 08/06/1995  Date:  02/22/2018  Clinical Social Worker Initiating Note:  Laurey Arrow Date/Time: Initiated:  02/21/18/0935     Child's Name:  Rebecca Lucero   Biological Parents:  Mother, Father(FOB is Rebecca Lucero 10/02/1990)   Need for Interpreter:  None   Reason for Referral:  Current Substance Use/Substance Use During Pregnancy , Behavioral Health Concerns(hx of marijuana use and anxiety.)   Address:  [address] [place]  [address]    Phone number:  [phone] (home)     Additional phone number:   Household Members/Support Persons (HM/SP):   (Per MOB, MOB's lives with MOB's parents and brother.)   HM/SP Name Relationship DOB or Age  HM/SP -1        HM/SP -2        HM/SP -3        HM/SP -4        HM/SP -5        HM/SP -6        HM/SP -7        HM/SP -8          Natural Supports (not living in the home):  Parent, Extended Family, Immediate Family(FOB's family will also provide supports.)   Professional Supports:     Employment: Unemployed   Type of Work:     Education:  [place] arranged:    Museum/gallery curator Resources:  Medicaid   Other Resources:      Cultural/Religious Considerations Which May Impact Care:    Strengths:  Ability to meet basic needs , Home prepared for child , Pediatrician chosen   Psychotropic Medications:         Pediatrician:    Careers adviser area  Pediatrician List:   [place]      Pediatrician Fax Number:    Risk Factors/Current Problems:  Mental Health Concerns , Substance Use    Cognitive State:  Insightful , Linear Thinking , Able to Concentrate    Mood/Affect:  Relaxed , Happy , Comfortable , Bright    CSW Assessment:CSW met with MOB in MOB's first  floor room/134 to offer support and complete assessment due to hx of Anxiety and marijuana use.  MOB was quiet, but pleasant.  This is MOB's first baby, and her facial expression communicated excitement about being a new mom.    MOB states that she and baby are doing well.  She reports that she has everything needed for baby and a good support system.  She is aware of SIDS precautions.  She acknowledges a hx of anxiety and reported that her panic attacks are situational.  MOB reported not have any signs or symptoms in over 1 year and not current taken in medications. CSW provided information and resources  regarding PMADs.  She states no questions, concerns or needs at this time. CSW inquired about marijuana use.  She states no use once she found out she was pregnant and no concerns related to hospital drug screen policy as discussed by CSW.  Baby's UDS is negative.  CSW will monitor CDS and make report if warranted.   CSW Plan/Description:  No Further Intervention Required/No Barriers to Discharge, Perinatal Mood and Anxiety Disorder (PMADs) Education, Sudden Infant Death Syndrome (SIDS)  Education, [place], Other Information/Referral to [employer], CSW Will Continue to Monitor Umbilical Cord Tissue Drug Screen Results and Make Report if Warranted   Laurey Arrow, MSW, LCSW Clinical Social Work [phone]   Dimple Nanas, [place] 02/22/2018, 9:46 AM

## 2018-02-22 NOTE — Lactation Note (Signed)
This note was copied from a baby's chart. Lactation Consultation Note; Baby fussy as I went into room. Offered assist with latch and mom agreeable. Mom reports baby has fed a lot through the night. Encouragement given. Baby latched well and swallows noted. Reviewed cluster feeding and encouraged to nap whenever baby naps. Reviewed engorgement prevention and treatment. Has DEBP for home- she thinks it is Medela brand. Family member asking about pacifier use. Encouraged to wait a few Tayden Nichelson until baby gets better at breast feeding. No further questions at present. Reviewed our phone number, OP appointments and BFSG as resources for support after DC. To call prn  Patient Name: Rebecca Lucero [id number] Date: 02/22/2018 Reason for consult: Follow-up assessment   Maternal Data Formula Feeding for Exclusion: No Has patient been taught Hand Expression?: Yes Does the patient have breastfeeding experience prior to this delivery?: No  Feeding Feeding Type: Breast Fed  LATCH Score Latch: Grasps breast easily, tongue down, lips flanged, rhythmical sucking.  Audible Swallowing: Spontaneous and intermittent  Type of Nipple: Everted at rest and after stimulation  Comfort (Breast/Nipple): Filling, red/small blisters or bruises, mild/mod discomfort  Hold (Positioning): Assistance needed to correctly position infant at breast and maintain latch.  LATCH Score: 8  Interventions Interventions: Breast feeding basics reviewed;Breast massage;Hand express;Breast compression  Lactation Tools Discussed/Used WIC Program: No   Consult Status Consult Status: Complete    Rebecca Lucero 02/22/2018, 10:37 AM

## 2018-08-04 ENCOUNTER — Other Ambulatory Visit: Payer: Self-pay

## 2018-08-04 ENCOUNTER — Inpatient Hospital Stay (HOSPITAL_COMMUNITY): Payer: Medicaid Other

## 2018-08-04 ENCOUNTER — Inpatient Hospital Stay (HOSPITAL_COMMUNITY)
Admission: AD | Admit: 2018-08-04 | Discharge: 2018-08-04 | Disposition: A | Discharge status: Home / Self Care | Payer: Medicaid Other | Attending: Family Medicine | Admitting: Family Medicine

## 2018-08-04 ENCOUNTER — Encounter (HOSPITAL_COMMUNITY): Payer: Self-pay | Admitting: *Deleted

## 2018-08-04 DIAGNOSIS — Z3A13 13 weeks gestation of pregnancy: Secondary | ICD-10-CM | POA: Diagnosis not present

## 2018-08-04 DIAGNOSIS — O26891 Other specified pregnancy related conditions, first trimester: Secondary | ICD-10-CM | POA: Insufficient documentation

## 2018-08-04 DIAGNOSIS — Z87891 Personal history of nicotine dependence: Secondary | ICD-10-CM | POA: Diagnosis not present

## 2018-08-04 DIAGNOSIS — O209 Hemorrhage in early pregnancy, unspecified: Secondary | ICD-10-CM

## 2018-08-04 DIAGNOSIS — O468X1 Other antepartum hemorrhage, first trimester: Secondary | ICD-10-CM | POA: Diagnosis not present

## 2018-08-04 DIAGNOSIS — Z79899 Other long term (current) drug therapy: Secondary | ICD-10-CM | POA: Insufficient documentation

## 2018-08-04 DIAGNOSIS — Z791 Long term (current) use of non-steroidal anti-inflammatories (NSAID): Secondary | ICD-10-CM | POA: Diagnosis not present

## 2018-08-04 DIAGNOSIS — Z809 Family history of malignant neoplasm, unspecified: Secondary | ICD-10-CM | POA: Insufficient documentation

## 2018-08-04 DIAGNOSIS — O208 Other hemorrhage in early pregnancy: Secondary | ICD-10-CM | POA: Diagnosis not present

## 2018-08-04 DIAGNOSIS — Z3A10 10 weeks gestation of pregnancy: Secondary | ICD-10-CM | POA: Diagnosis not present

## 2018-08-04 DIAGNOSIS — O418X10 Other specified disorders of amniotic fluid and membranes, first trimester, not applicable or unspecified: Secondary | ICD-10-CM | POA: Diagnosis not present

## 2018-08-04 DIAGNOSIS — Z6740 Type O blood, Rh positive: Secondary | ICD-10-CM | POA: Diagnosis not present

## 2018-08-04 LAB — CBC WITH DIFFERENTIAL/PLATELET
Abs Immature Granulocytes: 0.03 10*3/uL (ref 0.00–0.07)
Basophils Absolute: 0.1 10*3/uL (ref 0.0–0.1)
Basophils Relative: 1 %
Eosinophils Absolute: 0.1 10*3/uL (ref 0.0–0.5)
Eosinophils Relative: 1 %
HCT: 37.3 % (ref 36.0–46.0)
Hemoglobin: 13.4 g/dL (ref 12.0–15.0)
Immature Granulocytes: 0 %
Lymphocytes Relative: 31 %
Lymphs Abs: 2.6 10*3/uL (ref 0.7–4.0)
MCH: 31.8 pg (ref 26.0–34.0)
MCHC: 35.9 g/dL (ref 30.0–36.0)
MCV: 88.6 fL (ref 80.0–100.0)
Monocytes Absolute: 0.7 10*3/uL (ref 0.1–1.0)
Monocytes Relative: 8 %
Neutro Abs: 5.1 10*3/uL (ref 1.7–7.7)
Neutrophils Relative %: 59 %
Platelets: 244 10*3/uL (ref 150–400)
RBC: 4.21 MIL/uL (ref 3.87–5.11)
RDW: 11.5 % (ref 11.5–15.5)
WBC: 8.5 10*3/uL (ref 4.0–10.5)
nRBC: 0 % (ref 0.0–0.2)

## 2018-08-04 LAB — URINALYSIS, ROUTINE W REFLEX MICROSCOPIC
Bilirubin Urine: NEGATIVE
Glucose, UA: NEGATIVE mg/dL
Ketones, ur: NEGATIVE mg/dL
Nitrite: NEGATIVE
Protein, ur: NEGATIVE mg/dL
Specific Gravity, Urine: 1.026 (ref 1.005–1.030)
pH: 6 (ref 5.0–8.0)

## 2018-08-04 LAB — HCG, QUANTITATIVE, PREGNANCY: hCG, Beta Chain, Quant, S: 98778 m[IU]/mL — ABNORMAL HIGH (ref <5)

## 2018-08-04 LAB — POCT PREGNANCY, URINE: Preg Test, Ur: POSITIVE — AB

## 2018-08-04 MED ORDER — PREPLUS 27-1 MG PO TABS: 1.0000 | ORAL_TABLET | Freq: Every day | ORAL | 13 refills | Status: DC
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
## 2018-08-04 NOTE — Discharge Instructions (Signed)
Prenatal Care Providers           [facility]   Phone: [phone]  [facility] @ [facility]   Phone: [phone]  [facility]       Phone: [phone]            [facility] @ [place]     Phone: [phone]          [facility] for [facility] @ [place]   Phone: [phone]  [facility] for [facility]  Phone: [phone]  [facility] for [facility] @ [facility] Phone: [phone]     [facility]  Phone: [phone]  [facility]  Phone: [phone]  [facility] Phone: [phone]  [facility] Phone: [phone]  [facility] Phone: [phone]  [facility] Phone: [phone]  [facility]  Phone: [phone]  Safe Medications in Pregnancy   Acne: Benzoyl Peroxide Salicylic Acid  Backache/Headache: Tylenol: 2 regular strength every 4 hours OR              2 Extra strength every 6 hours  Colds/Coughs/Allergies: Benadryl (alcohol free) 25 mg every 6 hours as needed Breath right strips Claritin Cepacol throat lozenges Chloraseptic throat spray Cold-Eeze- up to three times per day Cough drops, alcohol free Flonase (by prescription only) Guaifenesin Mucinex Robitussin DM (plain only, alcohol free) Saline nasal spray/drops Sudafed (pseudoephedrine) & Actifed ** use only after [redacted] weeks gestation and if you do not have high blood pressure Tylenol Vicks Vaporub Zinc lozenges Zyrtec   Constipation: Colace Ducolax suppositories Fleet enema Glycerin suppositories Metamucil Milk of magnesia Miralax Senokot Smooth move tea  Diarrhea: Kaopectate Imodium A-D  *NO pepto Bismol  Hemorrhoids: Anusol Anusol HC Preparation H Tucks  Indigestion: Tums Maalox Mylanta Zantac  Pepcid  Insomnia: Benadryl (alcohol free) 25mg   every 6 hours as needed Tylenol PM Unisom, no Gelcaps  Leg Cramps: Tums MagGel  Nausea/Vomiting:  Bonine Dramamine Emetrol Ginger extract Sea bands Meclizine  Nausea medication to take during pregnancy:  Unisom (doxylamine succinate 25 mg tablets) Take one tablet daily at bedtime. If symptoms are not adequately controlled, the dose can be increased to a maximum recommended dose of two tablets daily (1/2 tablet in the morning, 1/2 tablet mid-afternoon and one at bedtime). Vitamin B6 100mg  tablets. Take one tablet twice a day (up to 200 mg per day).  Skin Rashes: Aveeno products Benadryl cream or 25mg  every 6 hours as needed Calamine Lotion 1% cortisone cream  Yeast infection: Gyne-lotrimin 7 Monistat 7   **If taking multiple medications, please check labels to avoid duplicating the same active ingredients **take medication as directed on the label ** Do not exceed 4000 mg of tylenol in 24 hours **Do not take medications that contain aspirin or ibuprofen     First Trimester of Pregnancy  The first trimester of pregnancy is from week 1 until the end of week 13 (months 1 through 3). During this time, your baby will begin to develop inside you. At 6-8 weeks, the eyes and face are formed, and the heartbeat can be seen on ultrasound. At the end of 12 weeks, all the baby's organs are formed. Prenatal care is all the medical care you receive before the birth of your baby. Make sure you get good prenatal care and follow all of your doctor's instructions. Follow these instructions at home: Medicines  Take over-the-counter and prescription  medicines only as told by your doctor. Some medicines are safe and some medicines are not safe during pregnancy.  Take a prenatal vitamin that contains at least 600 micrograms (mcg) of folic acid.  If you have trouble pooping (constipation), take medicine that will make your stool soft (stool softener) if your doctor approves. Eating and  drinking   Eat regular, healthy meals.  Your doctor will tell you the amount of weight gain that is right for you.  Avoid raw meat and uncooked cheese.  If you feel sick to your stomach (nauseous) or throw up (vomit): ? Eat 4 or 5 small meals a day instead of 3 large meals. ? Try eating a few soda crackers. ? Drink liquids between meals instead of during meals.  To prevent constipation: ? Eat foods that are high in fiber, like fresh fruits and vegetables, whole grains, and beans. ? Drink enough fluids to keep your pee (urine) clear or pale yellow. Activity  Exercise only as told by your doctor. Stop exercising if you have cramps or pain in your lower belly (abdomen) or low back.  Do not exercise if it is too hot, too humid, or if you are in a place of great height (high altitude).  Try to avoid standing for long periods of time. Move your legs often if you must stand in one place for a long time.  Avoid heavy lifting.  Wear low-heeled shoes. Sit and stand up straight.  You can have sex unless your doctor tells you not to. Relieving pain and discomfort  Wear a good support bra if your breasts are sore.  Take warm water baths (sitz baths) to soothe pain or discomfort caused by hemorrhoids. Use hemorrhoid cream if your doctor says it is okay.  Rest with your legs raised if you have leg cramps or low back pain.  If you have puffy, bulging veins (varicose veins) in your legs: ? Wear support hose or compression stockings as told by your doctor. ? Raise (elevate) your feet for 15 minutes, 3-4 times a day. ? Limit salt in your food. Prenatal care  Schedule your prenatal visits by the twelfth week of pregnancy.  Write down your questions. Take them to your prenatal visits.  Keep all your prenatal visits as told by your doctor. This is important. Safety  Wear your seat belt at all times when driving.  Make a list of emergency phone numbers. The list should include numbers  for family, friends, the hospital, and police and fire departments. General instructions  Ask your doctor for a referral to a local prenatal class. Begin classes no later than at the start of month 6 of your pregnancy.  Ask for help if you need counseling or if you need help with nutrition. Your doctor can give you advice or tell you where to go for help.  Do not use hot tubs, steam rooms, or saunas.  Do not douche or use tampons or scented sanitary pads.  Do not cross your legs for long periods of time.  Avoid all herbs and alcohol. Avoid drugs that are not approved by your doctor.  Do not use any tobacco products, including cigarettes, chewing tobacco, and electronic cigarettes. If you need help quitting, ask your doctor. You may get counseling or other support to help you quit.  Avoid cat litter boxes and soil used by cats. These carry germs that can cause birth defects in the baby and can cause a loss of your baby (miscarriage)  or stillbirth.  Visit your dentist. At home, brush your teeth with a soft toothbrush. Be gentle when you floss. Contact a doctor if:  You are dizzy.  You have mild cramps or pressure in your lower belly.  You have a nagging pain in your belly area.  You continue to feel sick to your stomach, you throw up, or you have watery poop (diarrhea).  You have a bad smelling fluid coming from your vagina.  You have pain when you pee (urinate).  You have increased puffiness (swelling) in your face, hands, legs, or ankles. Get help right away if:  You have a fever.  You are leaking fluid from your vagina.  You have spotting or bleeding from your vagina.  You have very bad belly cramping or pain.  You gain or lose weight rapidly.  You throw up blood. It may look like coffee grounds.  You are around people who have [place] measles, fifth disease, or chickenpox.  You have a very bad headache.  You have shortness of breath.  You have any kind of  trauma, such as from a fall or a car accident. Summary  The first trimester of pregnancy is from week 1 until the end of week 13 (months 1 through 3).  To take care of yourself and your unborn baby, you will need to eat healthy meals, take medicines only if your doctor tells you to do so, and do activities that are safe for you and your baby.  Keep all follow-up visits as told by your doctor. This is important as your doctor will have to ensure that your baby is healthy and growing well. This information is not intended to replace advice given to you by your health care provider. Make sure you discuss any questions you have with your health care provider. Document Released: 06/24/2007 Document Revised: 04/28/2018 Document Reviewed: 01/14/2016 Elsevier Patient Education  2020 [place] Hematoma  A subchorionic hematoma is a gathering of blood between the outer wall of the embryo (chorion) and the inner wall of the womb (uterus). This condition can cause vaginal bleeding. If they cause little or no vaginal bleeding, early small hematomas usually shrink on their own and do not affect your baby or pregnancy. When bleeding starts later in pregnancy, or if the hematoma is larger or occurs in older pregnant women, the condition may be more serious. Larger hematomas may get bigger, which increases the chances of miscarriage. This condition also increases the risk of:  Premature separation of the placenta from the uterus.  Premature (preterm) labor.  Stillbirth. What are the causes? The exact cause of this condition is not known. It occurs when blood is trapped between the placenta and the uterine wall because the placenta has separated from the original site of implantation. What increases the risk? You are more likely to develop this condition if:  You were treated with fertility medicines.  You conceived through in vitro fertilization (IVF). What are the signs or  symptoms? Symptoms of this condition include:  Vaginal spotting or bleeding.  Contractions of the uterus. These cause abdominal pain. Sometimes you may have no symptoms and the bleeding may only be seen when ultrasound images are taken (transvaginal ultrasound). How is this diagnosed? This condition is diagnosed based on a physical exam. This includes a pelvic exam. You may also have other tests, including:  Blood tests.  Urine tests.  Ultrasound of the abdomen. How is this treated? Treatment for this condition  can vary. Treatment may include:  Watchful waiting. You will be monitored closely for any changes in bleeding. During this stage: ? The hematoma may be reabsorbed by the body. ? The hematoma may separate the fluid-filled space containing the embryo (gestational sac) from the wall of the womb (endometrium).  Medicines.  Activity restriction. This may be needed until the bleeding stops. Follow these instructions at home:  Stay on bed rest if told to do so by your health care provider.  Do not lift anything that is heavier than 10 lbs. (4.5 kg) or as told by your health care provider.  Do not use any products that contain nicotine or tobacco, such as cigarettes and e-cigarettes. If you need help quitting, ask your health care provider.  Track and write down the number of pads you use each day and how soaked (saturated) they are.  Do not use tampons.  Keep all follow-up visits as told by your health care provider. This is important. Your health care provider may ask you to have follow-up blood tests or ultrasound tests or both. Contact a health care provider if:  You have any vaginal bleeding.  You have a fever. Get help right away if:  You have severe cramps in your stomach, back, abdomen, or pelvis.  You pass large clots or tissue. Save any tissue for your health care provider to look at.  You have more vaginal bleeding, and you faint or become lightheaded or  weak. Summary  A subchorionic hematoma is a gathering of blood between the outer wall of the placenta and the uterus.  This condition can cause vaginal bleeding.  Sometimes you may have no symptoms and the bleeding may only be seen when ultrasound images are taken.  Treatment may include watchful waiting, medicines, or activity restriction. This information is not intended to replace advice given to you by your health care provider. Make sure you discuss any questions you have with your health care provider. Document Released: 04/22/2006 Document Revised: 12/18/2016 Document Reviewed: 03/03/2016 Elsevier Patient Education  2020 [employer]

## 2018-08-04 NOTE — MAU Note (Signed)
Just having some pretty heavy bleeding, it is pretty dark red. Wanted to get it checked out. Pt is preg.  Started bleeding today around 1730. No pain.2 nights ago woke up with sharp pain in her stomach, no bleeding then, no pain since

## 2018-08-10 ENCOUNTER — Other Ambulatory Visit (HOSPITAL_COMMUNITY): Payer: Self-pay | Admitting: Nurse Practitioner

## 2018-08-10 DIAGNOSIS — Z369 Encounter for antenatal screening, unspecified: Secondary | ICD-10-CM

## 2018-08-10 DIAGNOSIS — Z3A13 13 weeks gestation of pregnancy: Secondary | ICD-10-CM

## 2018-08-16 ENCOUNTER — Encounter (HOSPITAL_COMMUNITY): Payer: Self-pay | Admitting: *Deleted

## 2018-08-16 ENCOUNTER — Ambulatory Visit (HOSPITAL_COMMUNITY)
Admission: RE | Admit: 2018-08-16 | Discharge: 2018-08-16 | Disposition: A | Discharge status: Home / Self Care | Payer: Medicaid Other | Source: Ambulatory Visit | Attending: Obstetrics and Gynecology | Admitting: Obstetrics and Gynecology

## 2018-08-16 ENCOUNTER — Ambulatory Visit (HOSPITAL_COMMUNITY): Payer: Medicaid Other

## 2018-08-16 ENCOUNTER — Other Ambulatory Visit: Payer: Self-pay

## 2018-08-16 ENCOUNTER — Ambulatory Visit (HOSPITAL_COMMUNITY): Payer: Medicaid Other | Admitting: *Deleted

## 2018-08-16 VITALS — BP 119/76 | HR 100 | Temp 98.8°F | Wt 143.6 lb

## 2018-08-16 DIAGNOSIS — Z369 Encounter for antenatal screening, unspecified: Secondary | ICD-10-CM

## 2018-08-16 DIAGNOSIS — Z3682 Encounter for antenatal screening for nuchal translucency: Secondary | ICD-10-CM | POA: Insufficient documentation

## 2018-08-16 DIAGNOSIS — Z3A13 13 weeks gestation of pregnancy: Secondary | ICD-10-CM

## 2018-08-16 DIAGNOSIS — Z3A12 12 weeks gestation of pregnancy: Secondary | ICD-10-CM | POA: Diagnosis not present

## 2018-08-19 LAB — FIRST TRIMESTER SCREEN W/NT
CRL: 60.1 mm
DIA MoM: 0.91
DIA Value: 225.1 pg/mL
Gest Age-Collect: 12.3 weeks
Maternal Age At EDD: 23.2 yr
Nuchal Translucency MoM: 1.14
Nuchal Translucency: 1.6 mm
Number of Fetuses: 1
PAPP-A MoM: 0.42
PAPP-A Value: 431.1 ng/mL
Test Results:: NEGATIVE
Weight: 143 [lb_av]
hCG MoM: 0.87
hCG Value: 85.2 IU/mL

## 2018-08-22 ENCOUNTER — Telehealth (HOSPITAL_COMMUNITY): Payer: Self-pay | Admitting: Genetic Counselor

## 2018-08-22 NOTE — Telephone Encounter (Signed)
I called Rebecca Lucero to discuss her negative first trimester screen results. We reviewed that the risk for her pregnancy to be affected by Down syndrome decreased from her 1 in 997 age-related risk to 1 in 76, and the risk for trisomy 18 decreased from her 1 in 71 age-related risk to 1 in 4000 based on the results of this screen. Rebecca Lucero was reminded that while this screen significantly reduces the likelihood of the pregnancy being affected by trisomy 50 or trisomy 28, it cannot be considered diagnostic. Diagnostic testing via [facility] or amniocentesis is available should she be interested in pursuing this. We also reviewed that first trimester screening does not screen for open neural tube defects such as spina bifida, so her doctor should order AFP screening around 16-18 weeks to screen for this. Rebecca Lucero confirmed that she had no questions about these results.  Buelah Manis, MS Genetic Counselor

## 2018-08-26 LAB — OB RESULTS CONSOLE HIV ANTIBODY (ROUTINE TESTING): HIV: NONREACTIVE

## 2018-08-26 LAB — OB RESULTS CONSOLE HEPATITIS B SURFACE ANTIGEN: Hepatitis B Surface Ag: NEGATIVE

## 2018-08-26 LAB — OB RESULTS CONSOLE RPR: RPR: NONREACTIVE

## 2018-08-26 LAB — OB RESULTS CONSOLE RUBELLA ANTIBODY, IGM: Rubella: IMMUNE

## 2018-11-29 LAB — OB RESULTS CONSOLE RPR: RPR: NONREACTIVE

## 2019-01-20 NOTE — L&D Delivery Note (Addendum)
Delivery Note Rebecca Lucero is a G3P1011 at [redacted]w[redacted]d who had a spontaneous delivery at  a viable female was delivered on 02/23/2019 at 04:00am via OA.  APGAR: 8,9 ; weight 3734g    Admitted for labor, was 6cm on admission and contracting spontaneously. Received epidural for pain management. She was 10cm at 3:43am and AROM with thin meconium. Pushed for 16 minutes. Baby was delivered without difficulty. No nuchal cord. Delayed cord clamping for 60 seconds on mom's abdomen. Delivery of placenta was spontaneous. Placenta was found to be intact 3 -vessel cord was noted. The fundus was found to be firm. 1st degree perineal laceration and right labial tear was repaired in the normal sterile fashion with 3-0 vicryl. Estimated blood loss 200cc. Instrument and gauze counts were correct at the end of the procedure. Placenta to L&D.  Mom to postpartum.  Baby to Couplet care / Skin to Skin.  Rebecca Lucero 02/23/2019, 4:22 AM

## 2019-02-06 LAB — OB RESULTS CONSOLE GBS: GBS: NEGATIVE

## 2019-02-21 ENCOUNTER — Other Ambulatory Visit: Payer: Self-pay

## 2019-02-21 ENCOUNTER — Encounter (HOSPITAL_COMMUNITY): Payer: Self-pay | Admitting: Obstetrics

## 2019-02-21 ENCOUNTER — Inpatient Hospital Stay (EMERGENCY_DEPARTMENT_HOSPITAL)
Admission: AD | Admit: 2019-02-21 | Discharge: 2019-02-21 | Disposition: A | Discharge status: Home / Self Care | Payer: Medicaid Other | Source: Home / Self Care | Attending: Obstetrics | Admitting: Obstetrics

## 2019-02-21 DIAGNOSIS — O471 False labor at or after 37 completed weeks of gestation: Secondary | ICD-10-CM

## 2019-02-21 DIAGNOSIS — O479 False labor, unspecified: Secondary | ICD-10-CM

## 2019-02-21 DIAGNOSIS — A6009 Herpesviral infection of other urogenital tract: Secondary | ICD-10-CM | POA: Diagnosis not present

## 2019-02-21 DIAGNOSIS — Z3A39 39 weeks gestation of pregnancy: Secondary | ICD-10-CM | POA: Insufficient documentation

## 2019-02-21 DIAGNOSIS — O98313 Other infections with a predominantly sexual mode of transmission complicating pregnancy, third trimester: Secondary | ICD-10-CM | POA: Insufficient documentation

## 2019-02-21 DIAGNOSIS — A6000 Herpesviral infection of urogenital system, unspecified: Secondary | ICD-10-CM | POA: Insufficient documentation

## 2019-02-21 LAB — POCT FERN TEST: POCT Fern Test: NEGATIVE

## 2019-02-21 NOTE — MAU Note (Signed)
Presents with c/o ctxs, lost mucous plug yesterday.  Reports ctxs have spaced out since this morning.Denies VB or LOF.  Reports +FM.
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
## 2019-02-22 ENCOUNTER — Inpatient Hospital Stay (HOSPITAL_COMMUNITY): Payer: Medicaid Other | Admitting: Anesthesiology

## 2019-02-22 ENCOUNTER — Other Ambulatory Visit: Payer: Self-pay

## 2019-02-22 ENCOUNTER — Encounter (HOSPITAL_COMMUNITY): Payer: Self-pay | Admitting: Obstetrics and Gynecology

## 2019-02-22 ENCOUNTER — Inpatient Hospital Stay (HOSPITAL_COMMUNITY)
Admission: AD | Admit: 2019-02-22 | Discharge: 2019-02-24 | DRG: 807 | Disposition: A | Discharge status: Home / Self Care | Payer: Medicaid Other | Attending: Obstetrics & Gynecology | Admitting: Obstetrics & Gynecology

## 2019-02-22 DIAGNOSIS — Z3A39 39 weeks gestation of pregnancy: Secondary | ICD-10-CM | POA: Diagnosis not present

## 2019-02-22 DIAGNOSIS — A6000 Herpesviral infection of urogenital system, unspecified: Secondary | ICD-10-CM | POA: Diagnosis present

## 2019-02-22 DIAGNOSIS — Z20822 Contact with and (suspected) exposure to covid-19: Secondary | ICD-10-CM | POA: Diagnosis present

## 2019-02-22 DIAGNOSIS — O26893 Other specified pregnancy related conditions, third trimester: Secondary | ICD-10-CM | POA: Diagnosis present

## 2019-02-22 DIAGNOSIS — O9832 Other infections with a predominantly sexual mode of transmission complicating childbirth: Secondary | ICD-10-CM | POA: Diagnosis present

## 2019-02-22 LAB — TYPE AND SCREEN
ABO/RH(D): O POS
Antibody Screen: NEGATIVE

## 2019-02-22 LAB — RESPIRATORY PANEL BY RT PCR (FLU A&B, COVID)
Influenza A by PCR: NEGATIVE
Influenza B by PCR: NEGATIVE
SARS Coronavirus 2 by RT PCR: NEGATIVE

## 2019-02-22 LAB — CBC
HCT: 40.2 % (ref 36.0–46.0)
Hemoglobin: 13.9 g/dL (ref 12.0–15.0)
MCH: 31.2 pg (ref 26.0–34.0)
MCHC: 34.6 g/dL (ref 30.0–36.0)
MCV: 90.1 fL (ref 80.0–100.0)
Platelets: 219 10*3/uL (ref 150–400)
RBC: 4.46 MIL/uL (ref 3.87–5.11)
RDW: 12.5 % (ref 11.5–15.5)
WBC: 10.4 10*3/uL (ref 4.0–10.5)
nRBC: 0 % (ref 0.0–0.2)

## 2019-02-22 LAB — ABO/RH: ABO/RH(D): O POS

## 2019-02-22 MED ORDER — OXYTOCIN 40 UNITS IN NORMAL SALINE INFUSION - SIMPLE MED
2.5000 [IU]/h | INTRAVENOUS | Status: DC
  Administered 2019-02-23: 2.5 [IU]/h via INTRAVENOUS

## 2019-02-22 MED ORDER — EPHEDRINE 5 MG/ML INJ: 10.0000 mg | INTRAVENOUS | Status: DC | PRN

## 2019-02-22 MED ORDER — LACTATED RINGERS IV SOLN
500.0000 mL | Freq: Once | INTRAVENOUS | Status: AC
  Administered 2019-02-22: 500 mL via INTRAVENOUS

## 2019-02-22 MED ORDER — FENTANYL-BUPIVACAINE-NACL 0.5-0.125-0.9 MG/250ML-% EP SOLN
EPIDURAL | Status: AC
  Filled 2019-02-22: qty 250

## 2019-02-22 MED ORDER — LACTATED RINGERS IV SOLN: 500.0000 mL | INTRAVENOUS | Status: DC | PRN

## 2019-02-22 MED ORDER — FENTANYL-BUPIVACAINE-NACL 0.5-0.125-0.9 MG/250ML-% EP SOLN: 12.0000 mL/h | EPIDURAL | Status: DC | PRN

## 2019-02-22 MED ORDER — SOD CITRATE-CITRIC ACID 500-334 MG/5ML PO SOLN: 30.0000 mL | ORAL | Status: DC | PRN

## 2019-02-22 MED ORDER — PHENYLEPHRINE 40 MCG/ML (10ML) SYRINGE FOR IV PUSH (FOR BLOOD PRESSURE SUPPORT): 80.0000 ug | PREFILLED_SYRINGE | INTRAVENOUS | Status: DC | PRN

## 2019-02-22 MED ORDER — LACTATED RINGERS IV SOLN: INTRAVENOUS | Status: DC

## 2019-02-22 MED ORDER — OXYTOCIN BOLUS FROM INFUSION
500.0000 mL | Freq: Once | INTRAVENOUS | Status: AC
  Administered 2019-02-23: 500 mL via INTRAVENOUS

## 2019-02-22 MED ORDER — PHENYLEPHRINE 40 MCG/ML (10ML) SYRINGE FOR IV PUSH (FOR BLOOD PRESSURE SUPPORT)
PREFILLED_SYRINGE | INTRAVENOUS | Status: AC
  Filled 2019-02-22: qty 10

## 2019-02-22 MED ORDER — SODIUM CHLORIDE (PF) 0.9 % IJ SOLN
INTRAMUSCULAR | Status: DC | PRN
  Administered 2019-02-22: 12 mL/h via EPIDURAL

## 2019-02-22 MED ORDER — DIPHENHYDRAMINE HCL 50 MG/ML IJ SOLN: 12.5000 mg | INTRAMUSCULAR | Status: DC | PRN

## 2019-02-22 MED ORDER — OXYTOCIN 40 UNITS IN NORMAL SALINE INFUSION - SIMPLE MED
INTRAVENOUS | Status: AC
  Filled 2019-02-22: qty 1000

## 2019-02-22 MED ORDER — ONDANSETRON HCL 4 MG/2ML IJ SOLN: 4.0000 mg | Freq: Four times a day (QID) | INTRAMUSCULAR | Status: DC | PRN

## 2019-02-22 MED ORDER — LIDOCAINE HCL (PF) 1 % IJ SOLN
INTRAMUSCULAR | Status: DC | PRN
  Administered 2019-02-22 (×2): 4 mL via EPIDURAL

## 2019-02-22 MED ORDER — LACTATED RINGERS IV SOLN: 500.0000 mL | Freq: Once | INTRAVENOUS | Status: DC

## 2019-02-22 MED ORDER — LIDOCAINE HCL (PF) 1 % IJ SOLN: 30.0000 mL | INTRAMUSCULAR | Status: DC | PRN

## 2019-02-22 NOTE — Anesthesia Procedure Notes (Signed)
Epidural Patient location during procedure: OB Start time: 02/22/2019 10:51 PM End time: 02/22/2019 10:58 PM  Staffing Anesthesiologist: Mal Amabile, MD  Preanesthetic Checklist Completed: patient identified, IV checked, site marked, risks and benefits discussed, surgical consent, monitors and equipment checked, pre-op evaluation and timeout performed  Epidural Patient position: sitting Prep: DuraPrep and site prepped and draped Patient monitoring: continuous pulse ox and blood pressure Approach: midline Location: L3-L4 Injection technique: LOR air  Needle:  Needle type: Tuohy  Needle gauge: 17 G Needle length: 9 cm and 9 Needle insertion depth: 5 cm cm Catheter type: closed end flexible Catheter size: 19 Gauge Catheter at skin depth: 10 cm Test dose: negative and Other  Assessment Events: blood not aspirated, injection not painful, no injection resistance, no paresthesia and negative IV test  Additional Notes Patient identified. Risks and benefits discussed including failed block, incomplete  Pain control, post dural puncture headache, nerve damage, paralysis, blood pressure Changes, nausea, vomiting, reactions to medications-both toxic and allergic and post Partum back pain. All questions were answered. Patient expressed understanding and wished to proceed. Sterile technique was used throughout procedure. Epidural site was Dressed with sterile barrier dressing. No paresthesias, signs of intravascular injection Or signs of intrathecal spread were encountered.  Patient was more comfortable after the epidural was dosed. Please see RN's note for documentation of vital signs and FHR which are stable. Reason for block:procedure for pain

## 2019-02-22 NOTE — MAU Note (Signed)
Pt states that she had intermittent ctx's all night and today they have become closer together.  Denies vaginal bleeding or LOF.   Reports +FM
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
## 2019-02-23 ENCOUNTER — Encounter (HOSPITAL_COMMUNITY): Payer: Self-pay | Admitting: Obstetrics & Gynecology

## 2019-02-23 LAB — CBC
HCT: 35.2 % — ABNORMAL LOW (ref 36.0–46.0)
Hemoglobin: 12.5 g/dL (ref 12.0–15.0)
MCH: 32.1 pg (ref 26.0–34.0)
MCHC: 35.5 g/dL (ref 30.0–36.0)
MCV: 90.5 fL (ref 80.0–100.0)
Platelets: 199 10*3/uL (ref 150–400)
RBC: 3.89 MIL/uL (ref 3.87–5.11)
RDW: 12.6 % (ref 11.5–15.5)
WBC: 14.6 10*3/uL — ABNORMAL HIGH (ref 4.0–10.5)
nRBC: 0 % (ref 0.0–0.2)

## 2019-02-23 LAB — HERPES SIMPLEX VIRUS(HSV) DNA BY PCR: HSV 1 DNA: NEGATIVE

## 2019-02-23 LAB — RPR: RPR Ser Ql: NONREACTIVE

## 2019-02-23 LAB — HSV DNA BY PCR (REFERENCE LAB): HSV 2 DNA: NEGATIVE

## 2019-02-23 MED ORDER — DOCUSATE SODIUM 100 MG PO CAPS
100.0000 mg | ORAL_CAPSULE | Freq: Two times a day (BID) | ORAL | Status: DC
  Administered 2019-02-23 – 2019-02-24 (×2): 100 mg via ORAL
  Filled 2019-02-23 (×2): qty 1

## 2019-02-23 MED ORDER — DIBUCAINE (PERIANAL) 1 % EX OINT: 1.0000 "application " | TOPICAL_OINTMENT | CUTANEOUS | Status: DC | PRN

## 2019-02-23 MED ORDER — SODIUM CHLORIDE 0.9% FLUSH: 3.0000 mL | INTRAVENOUS | Status: DC | PRN

## 2019-02-23 MED ORDER — SIMETHICONE 80 MG PO CHEW: 80.0000 mg | CHEWABLE_TABLET | ORAL | Status: DC | PRN

## 2019-02-23 MED ORDER — OXYCODONE HCL 5 MG PO TABS: 10.0000 mg | ORAL_TABLET | ORAL | Status: DC | PRN

## 2019-02-23 MED ORDER — ONDANSETRON HCL 4 MG PO TABS: 4.0000 mg | ORAL_TABLET | ORAL | Status: DC | PRN

## 2019-02-23 MED ORDER — IBUPROFEN 600 MG PO TABS
600.0000 mg | ORAL_TABLET | Freq: Four times a day (QID) | ORAL | Status: DC
  Administered 2019-02-23 – 2019-02-24 (×5): 600 mg via ORAL
  Filled 2019-02-23 (×5): qty 1

## 2019-02-23 MED ORDER — SODIUM CHLORIDE 0.9 % IV SOLN: 250.0000 mL | INTRAVENOUS | Status: DC | PRN

## 2019-02-23 MED ORDER — SODIUM CHLORIDE 0.9% FLUSH: 3.0000 mL | Freq: Two times a day (BID) | INTRAVENOUS | Status: DC

## 2019-02-23 MED ORDER — BENZOCAINE-MENTHOL 20-0.5 % EX AERO: 1.0000 "application " | INHALATION_SPRAY | CUTANEOUS | Status: DC | PRN

## 2019-02-23 MED ORDER — ACETAMINOPHEN 325 MG PO TABS: 650.0000 mg | ORAL_TABLET | ORAL | Status: DC | PRN

## 2019-02-23 MED ORDER — OXYCODONE HCL 5 MG PO TABS: 5.0000 mg | ORAL_TABLET | ORAL | Status: DC | PRN

## 2019-02-23 MED ORDER — COCONUT OIL OIL: 1.0000 "application " | TOPICAL_OIL | Status: DC | PRN

## 2019-02-23 MED ORDER — ONDANSETRON HCL 4 MG/2ML IJ SOLN: 4.0000 mg | INTRAMUSCULAR | Status: DC | PRN

## 2019-02-23 MED ORDER — WITCH HAZEL-GLYCERIN EX PADS: 1.0000 "application " | MEDICATED_PAD | CUTANEOUS | Status: DC | PRN

## 2019-02-23 MED ORDER — TETANUS-DIPHTH-ACELL PERTUSSIS 5-2.5-18.5 LF-MCG/0.5 IM SUSP: 0.5000 mL | Freq: Once | INTRAMUSCULAR | Status: DC

## 2019-02-23 MED ORDER — DIPHENHYDRAMINE HCL 25 MG PO CAPS: 25.0000 mg | ORAL_CAPSULE | Freq: Four times a day (QID) | ORAL | Status: DC | PRN

## 2019-02-23 MED ORDER — PRENATAL MULTIVITAMIN CH
1.0000 | ORAL_TABLET | Freq: Every day | ORAL | Status: DC
  Administered 2019-02-23 – 2019-02-24 (×2): 1 via ORAL
  Filled 2019-02-23 (×2): qty 1

## 2019-02-23 NOTE — Lactation Note (Signed)
This note was copied from a baby's chart. Lactation Consultation Note Baby 18 hrs old. Mom called out for latch assistance. Stated baby will not open wide. When LC came into rm. Mom had baby on the breast in laid back position. Baby's face laying down into breast. Breast covering nose and 1/2 face. Sat mom upright explaining can't have baby's face covered. When sat mom upright more, baby came out of breast having nares exposed for breathing. Mom has good everted nipples. When unlatched baby nipple pinched. Repositioned baby in football position. Stroking top lip w/nipple baby opened wide and pushed baby onto breast. Explained until baby learns to open wide, mom needs to be more in control over baby and breast, football is a good position for baby to learn to BF. Discussed positioning, support and breast massage.  Mom demonstrated hand expression w/colostrum squirting out. Mom stated she is currently still BF her 1 yr old. Mom thanked [facility] for assistance.  Patient Name: Rebecca Lucero QASTM'H Date: 02/23/2019 Reason for consult: Mother's request;Term   Maternal Data Has patient been taught Hand Expression?: Yes  Feeding Feeding Type: Breast Fed  LATCH Score Latch: Grasps breast easily, tongue down, lips flanged, rhythmical sucking.  Audible Swallowing: Spontaneous and intermittent  Type of Nipple: Everted at rest and after stimulation  Comfort (Breast/Nipple): Filling, red/small blisters or bruises, mild/mod discomfort(a little sore/intact)  Hold (Positioning): Assistance needed to correctly position infant at breast and maintain latch.  LATCH Score: 8  Interventions Interventions: Support pillows;Assisted with latch;Position options;Skin to skin;Breast massage;Breast compression;Adjust position  Lactation Tools Discussed/Used     Consult Status Consult Status: Follow-up Date: 02/24/19 Follow-up type: In-patient    Charyl Dancer 02/23/2019, 10:19 PM

## 2019-02-23 NOTE — Lactation Note (Signed)
This note was copied from a baby's chart. Lactation Consultation Note  Patient Name: Boy Susane Bey OZDGU'Y Date: 02/23/2019 Reason for consult: Initial assessment;Term  Baby is 9 hours  Per mom still breastfeeding her 24 year old 3 x per day and no breast feeding  Challenges with her 1st baby.  Baby had last fed at 1110 for 15 mins with swallows and comfortable feeding.  Baby stirring in  Moms arms, LC offered to check the diaper and it was dry.  LC placed baby STS, assisted with proper support, ( Baby 8 pounds 3.7 oz at birth).  Mom started to latch with the cradle position and LC offered to show her the cross cradle and stressed the importance of depth at the breast and switch her arms to the cradle after depth achieved.  Baby sluggish with swallows and fed for 6 mins / per mom comfortable.  STS after feeding.  Teachable moment and LC preparing for the day of D/C and mom being experienced  Provided a hand pump with instructions and provided a #27 F for when the milk comes in. Per mom has a DEBP - Medela at home.  Discussed nutritive vs non- nutritive feeding patterns and the importance of watching the baby for hanging out latched.  Benefits of STS with feedings and in between.  Per mom feels comfortable with hand expressing . LC provided the colostrum container and spoon and encouraged to hand express between feedings/ and feeding back to baby.  LC provided the [facility] pamphlet with phone numbers and discussed [facility] for support groups.    Maternal Data Has patient been taught Hand Expression?: No Does the patient have breastfeeding experience prior to this delivery?: Yes  Feeding Feeding Type: Breast Fed  LATCH Score Latch: Grasps breast easily, tongue down, lips flanged, rhythmical sucking.  Audible Swallowing: A few with stimulation  Type of Nipple: Everted at rest and after stimulation  Comfort (Breast/Nipple): Soft / non-tender  Hold (Positioning): Assistance needed  to correctly position infant at breast and maintain latch.  LATCH Score: 8  Interventions Interventions: Breast feeding basics reviewed;Assisted with latch;Skin to skin;Breast massage;Hand express;Breast compression;Adjust position;Support pillows;Position options  Lactation Tools Discussed/Used WIC Program: No Pump Review: Milk Storage Initiated by:: MAI Date initiated:: 02/23/19   Consult Status Consult Status: Follow-up Date: 02/24/19 Follow-up type: In-patient    Matilde Sprang Pavan Bring 02/23/2019, 1:58 PM

## 2019-02-23 NOTE — Anesthesia Postprocedure Evaluation (Signed)
Anesthesia Post Note  Patient: Rebecca Lucero  Procedure(s) Performed: AN AD HOC LABOR EPIDURAL     Patient location during evaluation: Mother Baby Anesthesia Type: Epidural Level of consciousness: awake Pain management: satisfactory to patient Vital Signs Assessment: post-procedure vital signs reviewed and stable Respiratory status: spontaneous breathing Cardiovascular status: stable Anesthetic complications: no    Last Vitals:  Vitals:   02/23/19 0605 02/23/19 0644  BP: 112/76 109/77  Pulse: 82 79  Resp: 16 16  Temp: 36.8 C 37 C  SpO2: 100%     Last Pain:  Vitals:   02/23/19 0905  TempSrc:   PainSc: 0-No pain   Pain Goal:                   Cephus Shelling

## 2019-02-23 NOTE — Progress Notes (Signed)
MOB was referred for history of anxiety/panic attacks.  * Referral screened out by Clinical Social Worker because none of the following criteria appear to apply:  ~ History of anxiety/depression during this pregnancy, or of post-partum depression following prior delivery. ~ Diagnosis of anxiety and/or depression within last 3 years. Per chart review, it appears MOB anxiety and depression date back to 2016. No concerns noted in [facility] records.  OR * MOB's symptoms currently being treated with medication and/or therapy.  Please contact the Clinical Social Worker if needs arise or by MOB request.  Lear Ng, LCSW [facility] [phone]

## 2019-02-24 NOTE — Lactation Note (Addendum)
This note was copied from a baby's chart. Lactation Consultation Note  Patient Name: Rebecca Lucero GEZMO'Q Date: 02/24/2019 Reason for consult: Follow-up assessment  Infant is 48 hrs old. Mom commented that she has to work on helping him to open his mouth wider, but feels that he does well at the breast once he is latched. I gave her a tip on how to better achieve a wide gape. Mom knows I can return if she would like any hands-on assistance.  Mom is still nursing her 1st child (1 yr old).   Infant has had 2 voids & 6 stools since birth. [name] [facility] 02/24/2019, 9:46 AM

## 2019-02-24 NOTE — Progress Notes (Signed)
Patient is eating, ambulating, voiding.  Pain control is good.  Vitals:   02/23/19 1100 02/23/19 1530 02/23/19 1947 02/24/19 0548  BP: 110/73 107/71 112/81 114/82  Pulse: 87 72 79 86  Resp: 16 18    Temp: 98.4 F (36.9 C) 98.1 F (36.7 C) 97.8 F (36.6 C) 98.2 F (36.8 C)  TempSrc: Oral Oral    SpO2: 98% 98%    Weight:        Fundus firm Perineum without swelling.  Lab Results  Component Value Date   WBC 14.6 (H) 02/23/2019   HGB 12.5 02/23/2019   HCT 35.2 (L) 02/23/2019   MCV 90.5 02/23/2019   PLT 199 02/23/2019    --/--/O POS, O POS Performed at [facility], [address], [place], [place] [address]  (02/03 2148)/RI  A/P Post partum day 1.  Routine care.  Expect d/c routine.    Loney Laurence

## 2019-02-24 NOTE — Lactation Note (Signed)
This note was copied from a baby's chart. Lactation Consultation Note Baby 24 hrs old. Mom called for [facility] assistance in latching. Baby will not open mouth wide enough for latch. Mom stated baby has BF well several times but right now will not open but has cued. Mom trying to latch in cross cradle position. Assisted in football. Mom stated he feeds better in football position. Baby latched briefly then slept. Left baby STS w/mom. Mom has compressible everted nipples. Mom hand expressed colostrum easily. Baby not interested at this time. Encouraged mom to stoke top lip w/nipple to stimulate opening wider.  Patient Name: Rebecca Lucero Date: 02/24/2019 Reason for consult: Mother's request;Term   Maternal Data    Feeding Feeding Type: Breast Fed  LATCH Score Latch: Repeated attempts needed to sustain latch, nipple held in mouth throughout feeding, stimulation needed to elicit sucking reflex.  Audible Swallowing: None  Type of Nipple: Everted at rest and after stimulation  Comfort (Breast/Nipple): Soft / non-tender  Hold (Positioning): Assistance needed to correctly position infant at breast and maintain latch.  LATCH Score: 6  Interventions Interventions: Breast feeding basics reviewed;Support pillows;Assisted with latch;Position options;Skin to skin;Breast massage;Hand express;Breast compression;Adjust position  Lactation Tools Discussed/Used     Consult Status Consult Status: Follow-up Date: 02/24/19(in pm) Follow-up type: In-patient    Charyl Dancer 02/24/2019, 4:52 AM

## 2019-02-24 NOTE — Discharge Summary (Signed)
Obstetric Discharge Summary Reason for Admission: onset of labor Prenatal Procedures: none Intrapartum Procedures: spontaneous vaginal delivery Postpartum Procedures: none Complications-Operative and Postpartum: 1 degree perineal laceration Hemoglobin  Date Value Ref Range Status  02/23/2019 12.5 12.0 - 15.0 g/dL Final   HCT  Date Value Ref Range Status  02/23/2019 35.2 (L) 36.0 - 46.0 % Final    Discharge Diagnoses: Term Pregnancy-delivered  Discharge Information: Date: 02/24/2019 Activity: pelvic rest Diet: routine Medications: Ibuprofen Condition: stable Instructions: refer to practice specific booklet Discharge to: home Follow-up Information    Ob/Gyn, Nestor Ramp Follow up in 4 week(s).   Contact information: [address] [place] [address] [phone]           Newborn Data: Live born female  Birth Weight: 8 lb 3.7 oz (3734 g) APGAR: 8, 9  Newborn Delivery   Birth date/time: 02/23/2019 04:00:00 Delivery type: Vaginal, Spontaneous      Home with mother.  Rebecca Lucero 02/24/2019, 8:38 AM

## 2019-02-24 NOTE — Progress Notes (Signed)
Parents desires circumsision.  All risks, benefits and alternatives discussed with the mother.  Home after circ.

## 2019-04-13 ENCOUNTER — Other Ambulatory Visit: Payer: Self-pay

## 2019-04-13 ENCOUNTER — Encounter (HOSPITAL_BASED_OUTPATIENT_CLINIC_OR_DEPARTMENT_OTHER): Payer: Self-pay | Admitting: Obstetrics & Gynecology

## 2019-04-13 MED ORDER — LACTATED RINGERS IV SOLN
INTRAVENOUS | Status: DC
  Administered 2019-04-19: 1000 mL via INTRAVENOUS
  Filled 2019-04-13: qty 1000

## 2019-04-13 NOTE — Progress Notes (Signed)
Spoke with:  May NPO:   No food after midnight/Clear liquids until 8:30 AM DOS Arrival time: 930AM Lab needs dos----UPT              Lab results------T&S 04/17/2019 COVID test ------04/15/19 Medications to take morning of surgery:None Pre op orders in epic: Yes Diabetic medication -----N/A Patient Special Instructions -----N/A Pre-Op special Istructions -----N/A Ride home: Vernona Rieger (mom) [phone]  Patient verbalized understanding of instructions that were given at this phone interview. Patient denies shortness of breath, chest pain, fever, cough a this phone interview.

## 2019-04-13 NOTE — Patient Instructions (Addendum)
DUE TO COVID-19 ONLY ONE VISITOR IS ALLOWED IN WAITING ROOM (VISITOR WILL HAVE A TEMPERATURE CHECK ON ARRIVAL AND MUST WEAR A FACE MASK THE ENTIRE TIME.)  ONCE YOU ARE ADMITTED TO YOUR PRIVATE ROOM, THE SAME ONE VISITOR IS ALLOWED TO VISIT DURING VISITING HOURS ONLY.  Your COVID swab testing is scheduled for Saturday, April 15, 2019  At 12:35 PM  , You must self quarantine after your testing per handout given to you at the testing site.  ([address], [place] Drive up testing enter pre-surgical testing line)    Your procedure is scheduled on: Wednesday, April 19, 2019  Report to [facility] AT  9:30 A. M.   Call this number if you have problems the morning of surgery:  [phone].   OUR ADDRESS IS [address]  WE ARE LOCATED IN [place]                                   [place].                                     REMEMBER:  DO NOT EAT FOOD AFTER MIDNIGHT .    MAY HAVE LIQUIDS UNTIL 8:30 AM DAY OF SURGERY  CLEAR LIQUID DIET  Foods Allowed                                                                     Foods Excluded  Water, Black Coffee and tea, regular and decaf                             liquids that you cannot  Plain Jell-O in any flavor  (No red)                                           see through such as: Fruit ices (not with fruit pulp)                                     milk, soups, orange juice  Iced Popsicles (No red)                                    All solid food Carbonated beverages, regular and diet                                    Apple juices Sports drinks like Gatorade (No red) Lightly seasoned clear broth or consume(fat free) Sugar, honey syrup  Sample Menu Breakfast  Lunch                                     Supper Cranberry juice                    Beef broth                            Chicken broth Jell-O                                      Grape juice                           Apple juice Coffee or tea                        Jell-O                                      Popsicle                                                Coffee or tea                        Coffee or tea  DRINK ENSURE PRE SURGERY DRINK AT 8:30 AM DAY OF SURGERY (IN YOUR BAG YOU'LL RECEIVE WHEN YOU COME IN FOR LAB WORK ON Monday, MARCH 29)  BRUSH YOUR TEETH THE MORNING OF SURGERY.  TAKE THESE MEDICATIONS MORNING OF SURGERY WITH A SIP OF WATER: NONE   DO NOT WEAR JEWERLY, MAKE UP, OR NAIL POLISH.  DO NOT WEAR LOTIONS, POWDERS, PERFUMES/COLOGNE OR DEODORANT.  DO NOT SHAVE FOR 24 HOURS PRIOR TO DAY OF SURGERY.  CONTACTS, GLASSES, OR DENTURES MAY NOT BE WORN TO SURGERY.                                    [place] IS NOT RESPONSIBLE  FOR ANY BELONGINGS.                                          [place] - Preparing for Surgery Before surgery, you can play an important role.  Because skin is not sterile, your skin needs to be as free of germs as possible.  You can reduce the number of germs on your skin by washing with CHG (chlorahexidine gluconate) soap before surgery.  CHG is an antiseptic cleaner which kills germs and bonds with the skin to continue killing germs even after washing. Please DO NOT use if you have an allergy to CHG or antibacterial soaps.  If your skin becomes reddened/irritated stop using the CHG and inform your nurse when you arrive at Short Stay. Do not shave (including legs and underarms) for at least 48 hours prior to the first CHG shower.  You may shave your face/neck.  Please follow these instructions carefully:  1.  Shower with CHG Soap the night before surgery and the  morning of surgery.  2.  If you choose to wash your hair, wash your hair first as usual with your normal  shampoo.  3.  After you shampoo, rinse your hair and body thoroughly to remove the shampoo.                             4.  Use CHG as you would any other  liquid soap.  You can apply chg directly to the skin and wash.  Gently with a scrungie or clean washcloth.  5.  Apply the CHG Soap to your body ONLY FROM THE NECK DOWN.   Do   not use on face/ open                           Wound or open sores. Avoid contact with eyes, ears mouth and   genitals (private parts).                       Wash face,  Genitals (private parts) with your normal soap.             6.  Wash thoroughly, paying special attention to the area where your    surgery  will be performed.  7.  Thoroughly rinse your body with warm water from the neck down.  8.  DO NOT shower/wash with your normal soap after using and rinsing off the CHG Soap.                9.  Pat yourself dry with a clean towel.            10.  Wear clean pajamas.            11.  Place clean sheets on your bed the night of your first shower and do not  sleep with pets. Day of Surgery : Do not apply any lotions/deodorants the morning of surgery.  Please wear clean clothes to the hospital/surgery center.  FAILURE TO FOLLOW THESE INSTRUCTIONS MAY RESULT IN THE CANCELLATION OF YOUR SURGERY  PATIENT SIGNATURE_________________________________  NURSE SIGNATURE__________________________________  ________________________________________________________________________

## 2019-04-15 ENCOUNTER — Other Ambulatory Visit (HOSPITAL_COMMUNITY): Payer: Medicaid Other

## 2019-04-17 ENCOUNTER — Other Ambulatory Visit: Payer: Self-pay

## 2019-04-17 ENCOUNTER — Encounter (HOSPITAL_COMMUNITY)
Admission: RE | Admit: 2019-04-17 | Discharge: 2019-04-17 | Disposition: A | Discharge status: Home / Self Care | Payer: Medicaid Other | Source: Ambulatory Visit | Attending: Obstetrics & Gynecology | Admitting: Obstetrics & Gynecology

## 2019-04-17 ENCOUNTER — Other Ambulatory Visit (HOSPITAL_COMMUNITY)
Admission: RE | Admit: 2019-04-17 | Discharge: 2019-04-17 | Disposition: A | Discharge status: Home / Self Care | Payer: Medicaid Other | Source: Ambulatory Visit | Attending: Obstetrics & Gynecology | Admitting: Obstetrics & Gynecology

## 2019-04-17 DIAGNOSIS — Z20822 Contact with and (suspected) exposure to covid-19: Secondary | ICD-10-CM | POA: Insufficient documentation

## 2019-04-17 DIAGNOSIS — Z01812 Encounter for preprocedural laboratory examination: Secondary | ICD-10-CM | POA: Insufficient documentation

## 2019-04-17 LAB — SARS CORONAVIRUS 2 (TAT 6-24 HRS): SARS Coronavirus 2: NEGATIVE

## 2019-04-17 NOTE — Progress Notes (Signed)
Could not do type and screen at pre op visiit due to patient had babay 02-23-2019
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
## 2019-04-18 ENCOUNTER — Encounter (HOSPITAL_BASED_OUTPATIENT_CLINIC_OR_DEPARTMENT_OTHER): Payer: Self-pay | Admitting: Obstetrics & Gynecology

## 2019-04-18 NOTE — Anesthesia Preprocedure Evaluation (Addendum)
Anesthesia Evaluation  Patient identified by MRN, date of birth, ID band Patient awake    Reviewed: Allergy & Precautions, NPO status , Patient's Chart, lab work & pertinent test results  History of Anesthesia Complications (+) PONV  Airway Mallampati: I  TM Distance: >3 FB Neck ROM: Full    Dental no notable dental hx. (+) Teeth Intact, Dental Advisory Given   Pulmonary neg pulmonary ROS,    Pulmonary exam normal breath sounds clear to auscultation       Cardiovascular Exercise Tolerance: Good Normal cardiovascular exam Rhythm:Regular Rate:Normal     Neuro/Psych Anxiety negative neurological ROS     GI/Hepatic negative GI ROS, Neg liver ROS,   Endo/Other  negative endocrine ROS  Renal/GU negative Renal ROS     Musculoskeletal negative musculoskeletal ROS (+)   Abdominal (+) - obese,   Peds  Hematology   Anesthesia Other Findings   Reproductive/Obstetrics                            Anesthesia Physical Anesthesia Plan  ASA: I  Anesthesia Plan: General   Post-op Pain Management:    Induction: Intravenous  PONV Risk Score and Plan: 4 or greater and Treatment may vary due to age or medical condition, Dexamethasone, Scopolamine patch - Pre-op, Midazolam and Ondansetron  Airway Management Planned: Oral ETT  Additional Equipment:   Intra-op Plan:   Post-operative Plan: Extubation in OR  Informed Consent: I have reviewed the patients History and Physical, chart, labs and discussed the procedure including the risks, benefits and alternatives for the proposed anesthesia with the patient or authorized representative who has indicated his/her understanding and acceptance.     Dental advisory given  Plan Discussed with: CRNA  Anesthesia Plan Comments:        Anesthesia Quick Evaluation

## 2019-04-19 ENCOUNTER — Ambulatory Visit (HOSPITAL_BASED_OUTPATIENT_CLINIC_OR_DEPARTMENT_OTHER)
Admission: RE | Admit: 2019-04-19 | Discharge: 2019-04-19 | Disposition: A | Discharge status: Home / Self Care | Payer: Medicaid Other | Attending: Obstetrics & Gynecology | Admitting: Obstetrics & Gynecology

## 2019-04-19 ENCOUNTER — Other Ambulatory Visit: Payer: Self-pay

## 2019-04-19 ENCOUNTER — Encounter (HOSPITAL_BASED_OUTPATIENT_CLINIC_OR_DEPARTMENT_OTHER): Payer: Self-pay | Admitting: Obstetrics & Gynecology

## 2019-04-19 ENCOUNTER — Ambulatory Visit (HOSPITAL_BASED_OUTPATIENT_CLINIC_OR_DEPARTMENT_OTHER): Payer: Medicaid Other | Admitting: Anesthesiology

## 2019-04-19 ENCOUNTER — Encounter (HOSPITAL_BASED_OUTPATIENT_CLINIC_OR_DEPARTMENT_OTHER)
Admission: RE | Disposition: A | Discharge status: Home / Self Care | Payer: Self-pay | Source: Home / Self Care | Attending: Obstetrics & Gynecology

## 2019-04-19 DIAGNOSIS — Z9851 Tubal ligation status: Secondary | ICD-10-CM

## 2019-04-19 DIAGNOSIS — Z79899 Other long term (current) drug therapy: Secondary | ICD-10-CM | POA: Diagnosis not present

## 2019-04-19 DIAGNOSIS — Z302 Encounter for sterilization: Secondary | ICD-10-CM | POA: Diagnosis not present

## 2019-04-19 HISTORY — PX: LAPAROSCOPIC BILATERAL SALPINGECTOMY: SHX5889

## 2019-04-19 HISTORY — DX: Other specified postprocedural states: Z98.890

## 2019-04-19 HISTORY — DX: Nausea with vomiting, unspecified: R11.2

## 2019-04-19 LAB — TYPE AND SCREEN
ABO/RH(D): O POS
Antibody Screen: NEGATIVE

## 2019-04-19 LAB — POCT PREGNANCY, URINE: Preg Test, Ur: NEGATIVE

## 2019-04-19 SURGERY — SALPINGECTOMY, BILATERAL, LAPAROSCOPIC
Anesthesia: General | Site: Abdomen | Laterality: Bilateral

## 2019-04-19 MED ORDER — KETOROLAC TROMETHAMINE 15 MG/ML IJ SOLN
15.0000 mg | INTRAMUSCULAR | Status: DC
  Filled 2019-04-19: qty 1

## 2019-04-19 MED ORDER — MIDAZOLAM HCL 2 MG/2ML IJ SOLN
INTRAMUSCULAR | Status: AC
  Filled 2019-04-19: qty 2

## 2019-04-19 MED ORDER — LIDOCAINE 2% (20 MG/ML) 5 ML SYRINGE
INTRAMUSCULAR | Status: DC | PRN
  Administered 2019-04-19: 100 mg via INTRAVENOUS

## 2019-04-19 MED ORDER — PROPOFOL 10 MG/ML IV BOLUS
INTRAVENOUS | Status: DC | PRN
  Administered 2019-04-19: 150 mg via INTRAVENOUS

## 2019-04-19 MED ORDER — MIDAZOLAM HCL 2 MG/2ML IJ SOLN
INTRAMUSCULAR | Status: DC | PRN
  Administered 2019-04-19: 1 mg via INTRAVENOUS

## 2019-04-19 MED ORDER — LIDOCAINE 2% (20 MG/ML) 5 ML SYRINGE
INTRAMUSCULAR | Status: AC
  Filled 2019-04-19: qty 5

## 2019-04-19 MED ORDER — KETOROLAC TROMETHAMINE 30 MG/ML IJ SOLN
30.0000 mg | Freq: Once | INTRAMUSCULAR | Status: DC | PRN
  Filled 2019-04-19: qty 1

## 2019-04-19 MED ORDER — DEXAMETHASONE SODIUM PHOSPHATE 10 MG/ML IJ SOLN
INTRAMUSCULAR | Status: AC
  Filled 2019-04-19: qty 1

## 2019-04-19 MED ORDER — ROCURONIUM BROMIDE 10 MG/ML (PF) SYRINGE
PREFILLED_SYRINGE | INTRAVENOUS | Status: AC
  Filled 2019-04-19: qty 10

## 2019-04-19 MED ORDER — FENTANYL CITRATE (PF) 250 MCG/5ML IJ SOLN
INTRAMUSCULAR | Status: AC
  Filled 2019-04-19: qty 5

## 2019-04-19 MED ORDER — ACETAMINOPHEN 500 MG PO TABS
1000.0000 mg | ORAL_TABLET | Freq: Once | ORAL | Status: AC
  Administered 2019-04-19: 1000 mg via ORAL
  Filled 2019-04-19: qty 2

## 2019-04-19 MED ORDER — ENSURE PRE-SURGERY PO LIQD
296.0000 mL | Freq: Once | ORAL | Status: DC
  Filled 2019-04-19: qty 296

## 2019-04-19 MED ORDER — ONDANSETRON HCL 4 MG/2ML IJ SOLN
INTRAMUSCULAR | Status: AC
  Filled 2019-04-19: qty 2

## 2019-04-19 MED ORDER — ONDANSETRON HCL 4 MG/2ML IJ SOLN
4.0000 mg | Freq: Once | INTRAMUSCULAR | Status: AC | PRN
  Administered 2019-04-19: 4 mg via INTRAVENOUS
  Filled 2019-04-19: qty 2

## 2019-04-19 MED ORDER — ROCURONIUM BROMIDE 10 MG/ML (PF) SYRINGE
PREFILLED_SYRINGE | INTRAVENOUS | Status: DC | PRN
  Administered 2019-04-19: 45 mg via INTRAVENOUS

## 2019-04-19 MED ORDER — ACETAMINOPHEN 500 MG PO TABS
ORAL_TABLET | ORAL | Status: AC
  Filled 2019-04-19: qty 2

## 2019-04-19 MED ORDER — FENTANYL CITRATE (PF) 100 MCG/2ML IJ SOLN
INTRAMUSCULAR | Status: DC | PRN
  Administered 2019-04-19: 100 ug via INTRAVENOUS
  Administered 2019-04-19: 50 ug via INTRAVENOUS

## 2019-04-19 MED ORDER — KETOROLAC TROMETHAMINE 30 MG/ML IJ SOLN
INTRAMUSCULAR | Status: AC
  Filled 2019-04-19: qty 1

## 2019-04-19 MED ORDER — ONDANSETRON HCL 4 MG/2ML IJ SOLN
INTRAMUSCULAR | Status: DC | PRN
  Administered 2019-04-19: 4 mg via INTRAVENOUS

## 2019-04-19 MED ORDER — BUPIVACAINE HCL (PF) 0.25 % IJ SOLN
INTRAMUSCULAR | Status: DC | PRN
  Administered 2019-04-19: 15 mL

## 2019-04-19 MED ORDER — OXYCODONE HCL 5 MG PO TABS
5.0000 mg | ORAL_TABLET | Freq: Once | ORAL | Status: DC | PRN
  Filled 2019-04-19: qty 1

## 2019-04-19 MED ORDER — GABAPENTIN 300 MG PO CAPS
300.0000 mg | ORAL_CAPSULE | ORAL | Status: AC
  Administered 2019-04-19: 300 mg via ORAL
  Filled 2019-04-19: qty 1

## 2019-04-19 MED ORDER — DEXAMETHASONE SODIUM PHOSPHATE 10 MG/ML IJ SOLN
INTRAMUSCULAR | Status: DC | PRN
  Administered 2019-04-19: 10 mg via INTRAVENOUS

## 2019-04-19 MED ORDER — OXYCODONE HCL 5 MG PO TABS: 5.0000 mg | ORAL_TABLET | Freq: Four times a day (QID) | ORAL | 0 refills | Status: AC | PRN

## 2019-04-19 MED ORDER — GABAPENTIN 300 MG PO CAPS
ORAL_CAPSULE | ORAL | Status: AC
  Filled 2019-04-19: qty 1

## 2019-04-19 MED ORDER — SUGAMMADEX SODIUM 200 MG/2ML IV SOLN
INTRAVENOUS | Status: DC | PRN
  Administered 2019-04-19: 300 mg via INTRAVENOUS

## 2019-04-19 MED ORDER — LACTATED RINGERS IV SOLN
INTRAVENOUS | Status: DC
  Filled 2019-04-19: qty 1000

## 2019-04-19 MED ORDER — HYDROMORPHONE HCL 1 MG/ML IJ SOLN
0.2500 mg | INTRAMUSCULAR | Status: DC | PRN
  Filled 2019-04-19: qty 0.5

## 2019-04-19 MED ORDER — SCOPOLAMINE 1 MG/3DAYS TD PT72
1.0000 | MEDICATED_PATCH | TRANSDERMAL | Status: DC
  Administered 2019-04-19: 1.5 mg via TRANSDERMAL
  Filled 2019-04-19: qty 1

## 2019-04-19 MED ORDER — SCOPOLAMINE 1 MG/3DAYS TD PT72
MEDICATED_PATCH | TRANSDERMAL | Status: AC
  Filled 2019-04-19: qty 1

## 2019-04-19 MED ORDER — KETOROLAC TROMETHAMINE 30 MG/ML IJ SOLN
INTRAMUSCULAR | Status: DC | PRN
  Administered 2019-04-19: 30 mg via INTRAVENOUS

## 2019-04-19 MED ORDER — OXYCODONE HCL 5 MG/5ML PO SOLN
5.0000 mg | Freq: Once | ORAL | Status: DC | PRN
  Filled 2019-04-19: qty 5

## 2019-04-19 SURGICAL SUPPLY — 26 items
ADH SKN CLS APL DERMABOND .7 (GAUZE/BANDAGES/DRESSINGS) ×1
BAG SPEC RTRVL LRG 6X4 10 (ENDOMECHANICALS)
DERMABOND ADVANCED (GAUZE/BANDAGES/DRESSINGS) ×2
DERMABOND ADVANCED .7 DNX12 (GAUZE/BANDAGES/DRESSINGS) ×1 IMPLANT
FORCEPS CUTTING 33CM 5MM (CUTTING FORCEPS) IMPLANT
GLOVE BIOGEL PI IND STRL 6 (GLOVE) ×1 IMPLANT
GLOVE BIOGEL PI INDICATOR 6 (GLOVE) ×2
GLOVE ECLIPSE 6.0 STRL STRAW (GLOVE) ×3 IMPLANT
GOWN STRL REUS W/TWL LRG LVL3 (GOWN DISPOSABLE) ×6 IMPLANT
KIT TURNOVER CYSTO (KITS) ×3 IMPLANT
LIGASURE VESSEL 5MM BLUNT TIP (ELECTROSURGICAL) ×2 IMPLANT
NS IRRIG 1000ML POUR BTL (IV SOLUTION) ×3 IMPLANT
PACK LAPAROSCOPY BASIN (CUSTOM PROCEDURE TRAY) ×3 IMPLANT
PACK TRENDGUARD 450 HYBRID PRO (MISCELLANEOUS) IMPLANT
POUCH SPECIMEN RETRIEVAL 10MM (ENDOMECHANICALS) IMPLANT
PROTECTOR NERVE ULNAR (MISCELLANEOUS) ×2 IMPLANT
SET IRRIG TUBING LAPAROSCOPIC (IRRIGATION / IRRIGATOR) IMPLANT
SET TUBE SMOKE EVAC HIGH FLOW (TUBING) ×3 IMPLANT
SUT VICRYL 0 UR6 27IN ABS (SUTURE) ×3 IMPLANT
SUT VICRYL RAPIDE 3 0 (SUTURE) ×3 IMPLANT
TOWEL OR 17X26 10 PK STRL BLUE (TOWEL DISPOSABLE) ×3 IMPLANT
TRAY FOLEY W/BAG SLVR 14FR (SET/KITS/TRAYS/PACK) ×3 IMPLANT
TRENDGUARD 450 HYBRID PRO PACK (MISCELLANEOUS)
TROCAR BLADELESS OPT 5 100 (ENDOMECHANICALS) ×6 IMPLANT
TROCAR XCEL NON-BLD 11X100MML (ENDOMECHANICALS) ×3 IMPLANT
WARMER LAPAROSCOPE (MISCELLANEOUS) ×3 IMPLANT

## 2019-04-19 NOTE — Discharge Instructions (Signed)
Laparoscopic Tubal Ligation, Care After This sheet gives you information about how to care for yourself after your procedure. Your health care provider may also give you more specific instructions. If you have problems or questions, contact your health care provider. What can I expect after the procedure? After the procedure, it is common to have:  A sore throat.  Discomfort in your shoulder.  Mild discomfort or cramping in your abdomen.  Gas pains.  Pain or soreness in the area where the surgical incision was made.  A bloated feeling.  Tiredness.  Nausea.  Vomiting. Follow these instructions at home: Medicines  Take over-the-counter and prescription medicines only as told by your health care provider.  Do not take aspirin because it can cause bleeding.  Ask your health care provider if the medicine prescribed to you: ? Requires you to avoid driving or using heavy machinery. ? Can cause constipation. You may need to take actions to prevent or treat constipation, such as:  Drink enough fluid to keep your urine pale yellow.  Take over-the-counter or prescription medicines.  Eat foods that are high in fiber, such as beans, whole grains, and fresh fruits and vegetables.  Limit foods that are high in fat and processed sugars, such as fried or sweet foods. Incision care      Follow instructions from your health care provider about how to take care of your incision. Make sure you: ? Wash your hands with soap and water before and after you change your bandage (dressing). If soap and water are not available, use hand sanitizer. ? Change your dressing as told by your health care provider. ? Leave stitches (sutures), skin glue, or adhesive strips in place. These skin closures may need to stay in place for 2 weeks or longer. If adhesive strip edges start to loosen and curl up, you may trim the loose edges. Do not remove adhesive strips completely unless your health care provider  tells you to do that.  Check your incision area every day for signs of infection. Check for: ? Redness, swelling, or pain. ? Fluid or blood. ? Warmth. ? Pus or a bad smell. Activity  Rest as told by your health care provider.  Avoid sitting for a long time without moving. Get up to take short walks every 1-2 hours. This is important to improve blood flow and breathing. Ask for help if you feel weak or unsteady.  Return to your normal activities as told by your health care provider. Ask your health care provider what activities are safe for you. General instructions  Do not take baths, swim, or use a hot tub until your health care provider approves. Ask your health care provider if you may take showers. You may only be allowed to take sponge baths.  Have someone help you with your daily household tasks for the first few days.  Keep all follow-up visits as told by your health care provider. This is important. Contact a health care provider if:  You have redness, swelling, or pain around your incision.  Your incision feels warm to the touch.  You have pus or a bad smell coming from your incision.  The edges of your incision break open after the sutures have been removed.  Your pain does not improve after 2-3 days.  You have a rash.  You repeatedly become dizzy or light-headed.  Your pain medicine is not helping. Get help right away if you:  Have a fever.  Faint.  Have increasing   pain in your abdomen.  Have severe pain in one or both of your shoulders.  Have fluid or blood coming from your sutures or from your vagina.  Have shortness of breath or difficulty breathing.  Have chest pain or leg pain.  Have ongoing nausea, vomiting, or diarrhea. Summary  After the procedure, it is common to have mild discomfort or cramping in your abdomen.  Take over-the-counter and prescription medicines only as told by your health care provider.  Watch for symptoms that should  prompt you to call your health care provider.  Keep all follow-up visits as told by your health care provider. This is important. This information is not intended to replace advice given to you by your health care provider. Make sure you discuss any questions you have with your health care provider. Document Revised: 06/14/2018 Document Reviewed: 11/30/2017 Elsevier Patient Education  2020 [employer].   Post Anesthesia Home Care Instructions  Activity: Get plenty of rest for the remainder of the day. A responsible individual must stay with you for 24 hours following the procedure.  For the next 24 hours, DO NOT: -Drive a car -Advertising copywriter -Drink alcoholic beverages -Take any medication unless instructed by your physician -Make any legal decisions or sign important papers.  Meals: Start with liquid foods such as gelatin or soup. Progress to regular foods as tolerated. Avoid greasy, spicy, heavy foods. If nausea and/or vomiting occur, drink only clear liquids until the nausea and/or vomiting subsides. Call your physician if vomiting continues.  Special Instructions/Symptoms: Your throat may feel dry or sore from the anesthesia or the breathing tube placed in your throat during surgery. If this causes discomfort, gargle with warm salt water. The discomfort should disappear within 24 hours.  If you had a scopolamine patch placed behind your ear for the management of post- operative nausea and/or vomiting:  1. The medication in the patch is effective for 72 hours, after which it should be removed.  Wrap patch in a tissue and discard in the trash. Wash hands thoroughly with soap and water. 2. You may remove the patch earlier than 72 hours if you experience unpleasant side effects which may include dry mouth, dizziness or visual disturbances. 3. Avoid touching the patch. Wash your hands with soap and water after contact with the patch.  NO ADVIL, IBUPROFEN, ALEVE, OR MOTRIN UNTIL  AFTER 7:45 PM TONIGHT.

## 2019-04-19 NOTE — Anesthesia Postprocedure Evaluation (Signed)
Anesthesia Post Note  Patient: Jenita L Polakowski  Procedure(s) Performed: LAPAROSCOPIC BILATERAL SALPINGECTOMY (Bilateral Abdomen)     Patient location during evaluation: PACU Anesthesia Type: General Level of consciousness: awake and alert Pain management: pain level controlled Vital Signs Assessment: post-procedure vital signs reviewed and stable Respiratory status: spontaneous breathing, nonlabored ventilation, respiratory function stable and patient connected to nasal cannula oxygen Cardiovascular status: blood pressure returned to baseline and stable Postop Assessment: no apparent nausea or vomiting Anesthetic complications: no    Last Vitals:  Vitals:   04/19/19 1212 04/19/19 1215  BP: 102/69 109/87  Pulse: (!) 104 (!) 103  Resp: 15 19  Temp: (!) 35.9 C   SpO2: 100% 98%    Last Pain:  Vitals:   04/19/19 1212  TempSrc:   PainSc: 0-No pain                 Trevor Iha

## 2019-04-19 NOTE — Brief Op Note (Signed)
04/19/2019  12:02 PM  PATIENT:  Rebecca Lucero  24 y.o. female  PRE-OPERATIVE DIAGNOSIS:  desires sterilization  POST-OPERATIVE DIAGNOSIS:  desires sterilization  PROCEDURE:  Laparoscopic bilateral salpingectomy  SURGEON:  Surgeon(s) and Role:    * Taam-Akelman, Griselda Miner, MD - Primary    * Carrington Clamp, MD - Assisting   ANESTHESIA:   general  EBL:  5 mL   BLOOD ADMINISTERED:none  DRAINS: none   LOCAL MEDICATIONS USED:  MARCAINE     SPECIMEN:  Source of Specimen:  bilateral fallopian tubes  DISPOSITION OF SPECIMEN:  PATHOLOGY  COUNTS:  YES  TOURNIQUET:  * No tourniquets in log *  DICTATION: .Note written in EPIC  PLAN OF CARE: Discharge to home after PACU  PATIENT DISPOSITION:  PACU - hemodynamically stable.   Delay start of Pharmacological VTE agent (>24hrs) due to surgical blood loss or risk of bleeding: not applicable

## 2019-04-19 NOTE — Transfer of Care (Signed)
Immediate Anesthesia Transfer of Care Note  Patient: Aarti L Eichelberger  Procedure(s) Performed: LAPAROSCOPIC BILATERAL SALPINGECTOMY (Bilateral Abdomen)  Patient Location: PACU  Anesthesia Type:General  Level of Consciousness: awake, alert , oriented and patient cooperative  Airway & Oxygen Therapy: Patient Spontanous Breathing and Patient connected to nasal cannula oxygen  Post-op Assessment: Report given to RN and Post -op Vital signs reviewed and stable  Post vital signs: Reviewed and stable  Last Vitals:  Vitals Value Taken Time  BP    Temp    Pulse 104 04/19/19 1211  Resp    SpO2 100 % 04/19/19 1211  Vitals shown include unvalidated device data.  Last Pain:  Vitals:   04/19/19 1017  TempSrc: Oral  PainSc: 0-No pain      Patients Stated Pain Goal: 7 (04/19/19 1017)  Complications: No apparent anesthesia complications

## 2019-04-19 NOTE — Op Note (Signed)
Operative Note PATIENT:  Rebecca Lucero  24 y.o. female  PRE-OPERATIVE DIAGNOSIS:  desires sterilization  POST-OPERATIVE DIAGNOSIS:  desires sterilization  PROCEDURE:  Laparoscopic bilateral salpingectomy  SURGEON:  Surgeon(s) and Role:    * Taam-Akelman, Griselda Miner, MD - Primary    * Carrington Clamp, MD - Assisting  ANESTHESIA:   general  EBL:  5 mL   BLOOD ADMINISTERED:none  DRAINS: none   LOCAL MEDICATIONS USED:  MARCAINE     SPECIMEN:  Source of Specimen:  bilateral fallopian tubes  DISPOSITION OF SPECIMEN:  PATHOLOGY  COUNTS:  YES  TOURNIQUET:  * No tourniquets in log *  PLAN OF CARE: Discharge to home after PACU  PATIENT DISPOSITION:  PACU - hemodynamically stable.   Delay start of Pharmacological VTE agent (>24hrs) due to surgical blood loss or risk of bleeding: not applicable  Findings: Exam under anesthesia revealed small mobile anteverted uterus. Diagnostic laparoscopy revealed normal ovaries, fallopian tubes and uterus.   Description of procedure After consent was verified, the patient was taken to the operating room where general anesthesia was administered without difficulty. The patient was placed in the dorsal lithotomy position using Allen stirrups.  Exam under anesthesia was performed, revealing a small mobile anteverted uterus. The abdomen and vagina were subsequently prepped and draped in the normal sterile fashion. A foley catheter was placed. A speculum was placed in the patient's vagina and the anterior lip of the cervix was grasped with a single-tooth tenaculum and a hulka manipulator was placed through the os and secured to the anterior lip. The tenaculum and speculum were removed.   Attention was then turned to the patient's abdomen where a 38mm horizontal skin incision was made at the base of the umbilicus The abdominal wall was tented and the 66mm Optiview trochar and sleeve were advanced without difficulty into the abdomen where  intra-abdominal placement was confirmed by laparoscope. A second 56mm skin incision was made approximately 4cm above the right anterior superior iliac spine.  The second trochar and sleeve were then advanced under direct visualization. A third 42mm skin incision was made and trochar was placed in similar fashion approximately 4cm above the left anterior superior iliac spine. A blunt grasper was used to sweep the bowel superiorly and provide adequate visualization of the female anatomy. A survey of the patient's pelvis and abdomen revealed: normal ovaries, fallopian tubes and uterus.   Left salpingectomy was performed using the ligasure by cauterizing the mesosalpinx in a stepwise fashion. The left fallopian tube was removed through the trochar under direct visualization. The right right salpingectomy was performed in a similar fashion. The mesosalpinx was hemostatic. Both fallopian tubes were sent to pathology  All instruments were removed from the patient's abdomen. The umbilucis fascia was closed with vicryl on a UR 6 in a running fashion. The skin incisions were repaired with vicryl. The uterine manipulator was removed from the vagina, tenaculum sites were hemostatic. The patient tolerated the procedure well and was taken to the recovery room in stable condition. Sponge, lap, and needle counts were correct x3.  [name] 04/19/19 12:11 PM

## 2019-04-20 LAB — SURGICAL PATHOLOGY

## 2019-04-20 LAB — ABO/RH: ABO/RH(D): O POS

## 2021-04-03 IMAGING — US OBSTETRIC <14 WK ULTRASOUND
1 series · 15 of 28 positions shown · non-contrast
Comparison: None.

CLINICAL DATA: Bleeding

EXAM:
OBSTETRIC <14 WK ULTRASOUND
TECHNIQUE: Transabdominal ultrasound was performed for evaluation of the
gestation as well as the maternal uterus and adnexal regions.

[Series 1: obstetric <14 wk ultrasound · 15 of 29 slices shown]
[im 1/29]
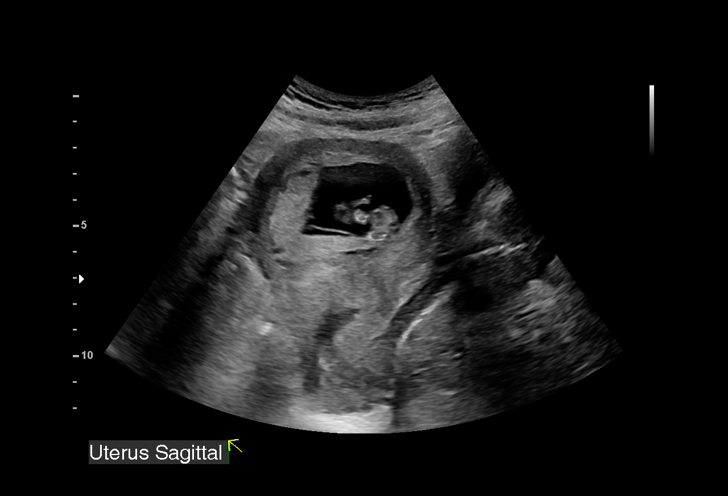
[im 3/29]
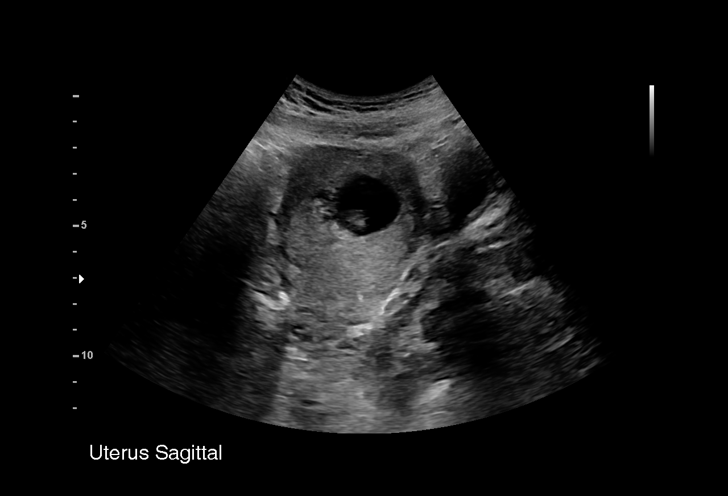
[im 5/29]
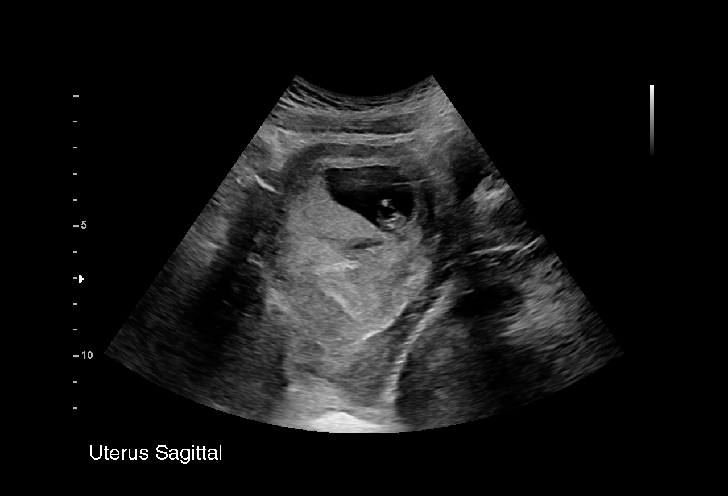
[im 7/29]
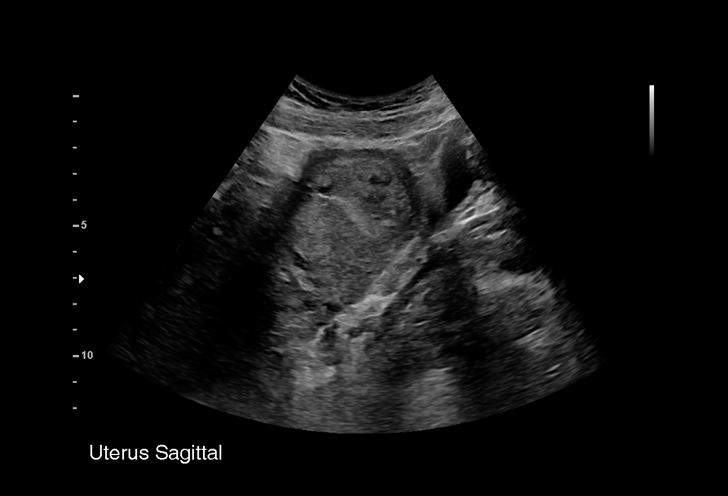
[im 9/29]
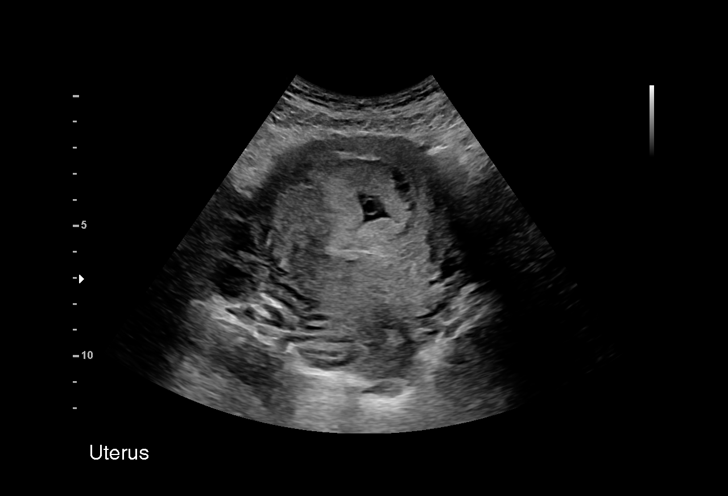
[im 11/29]
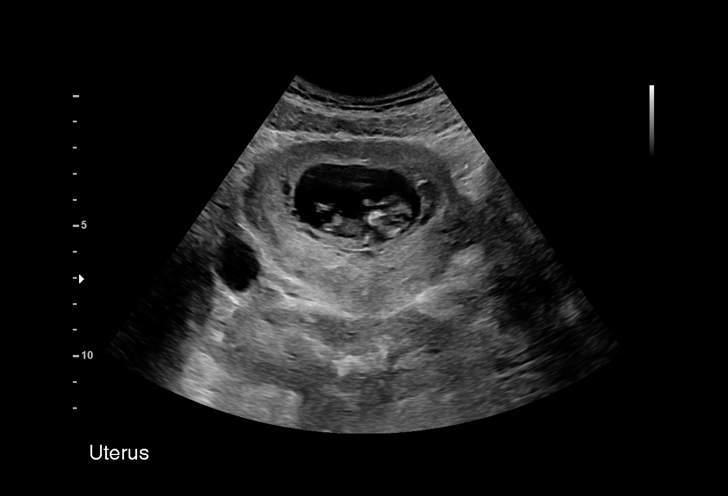
[im 13/29]
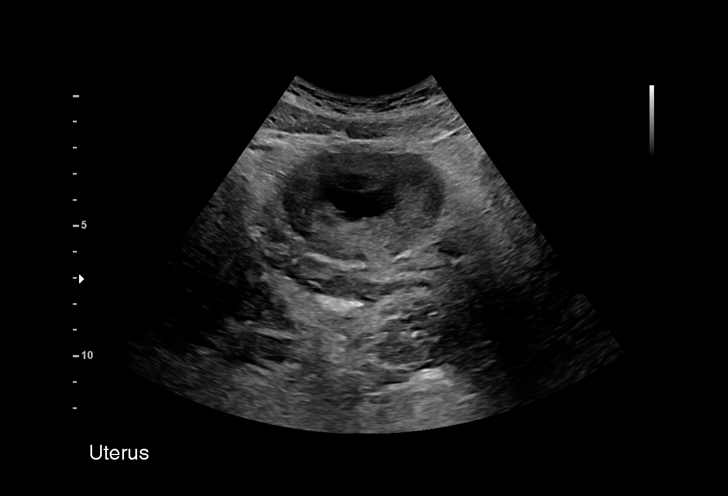
[im 15/29]
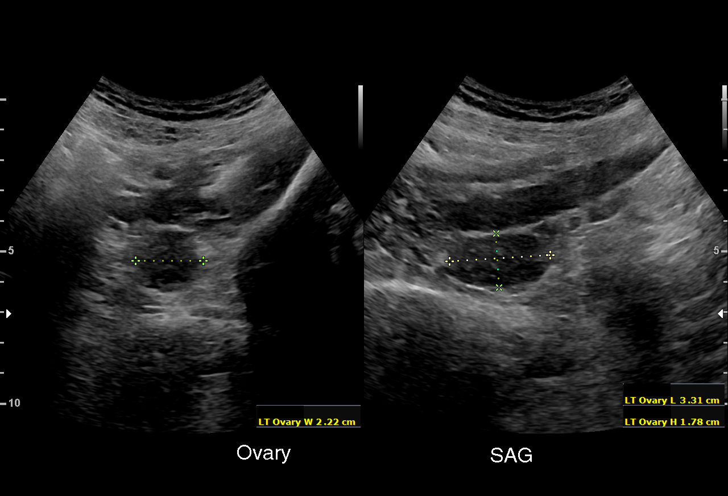
[im 16/29]
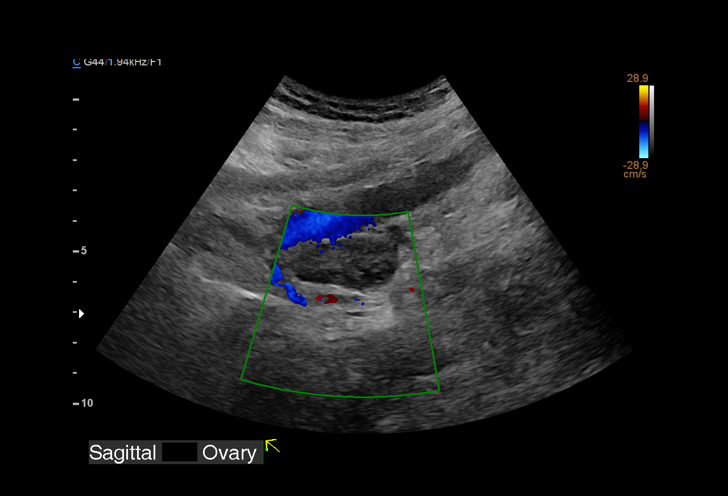
[im 18/29]
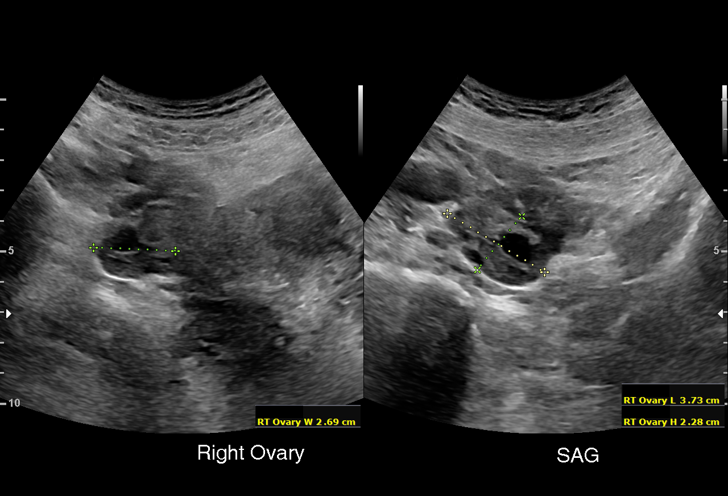
[im 20/29]
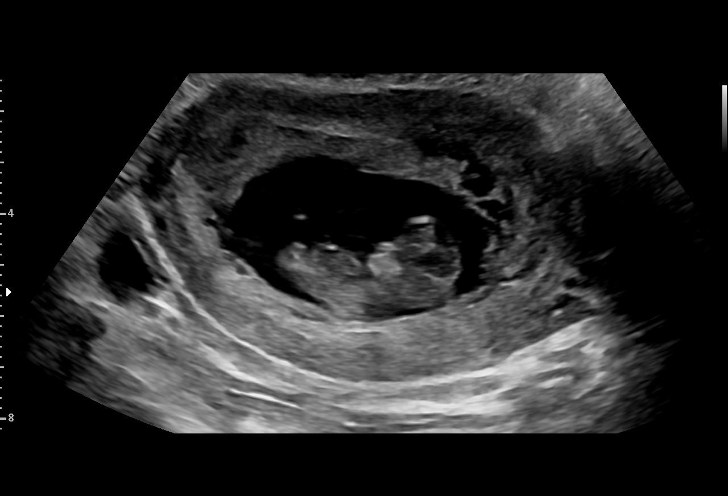
[im 22/29]
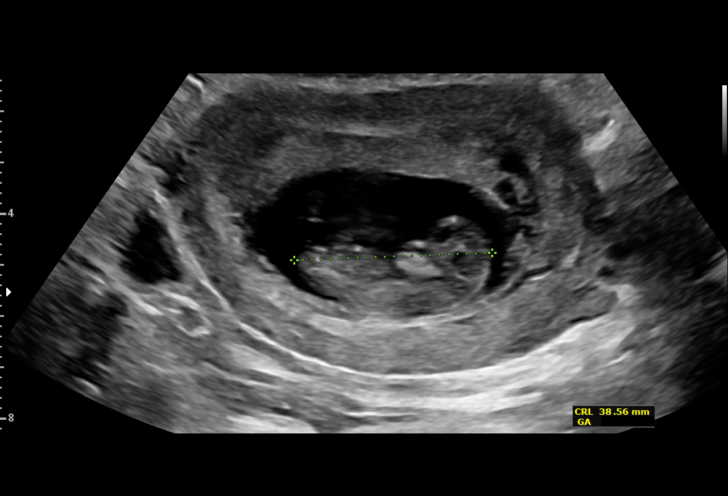
[im 24/29]
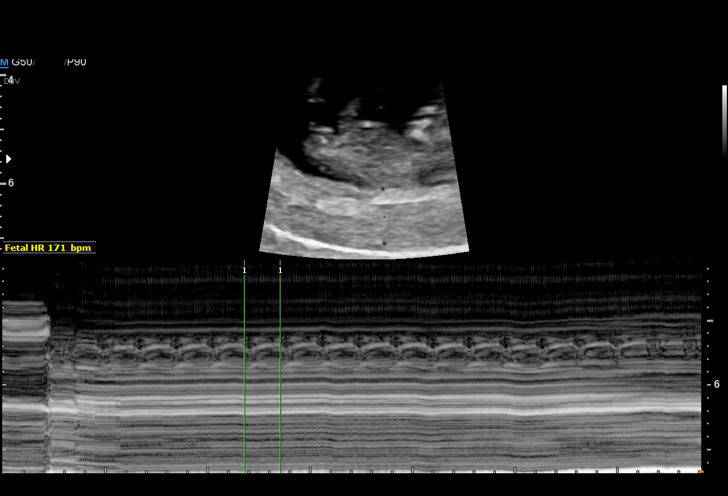
[im 26/29]
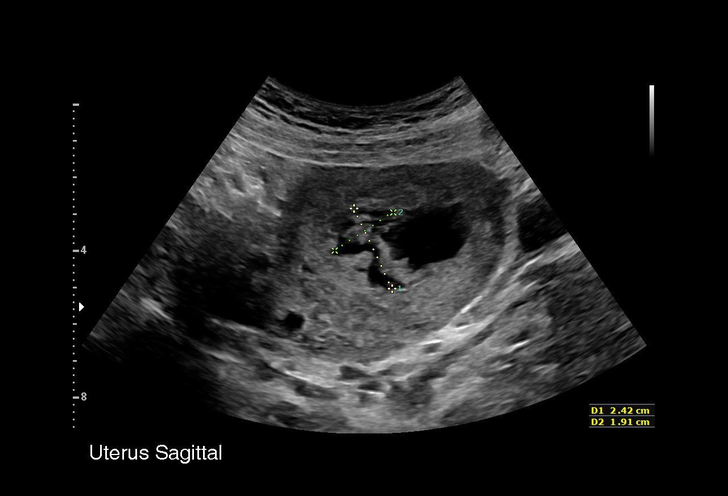
[im 29/29]
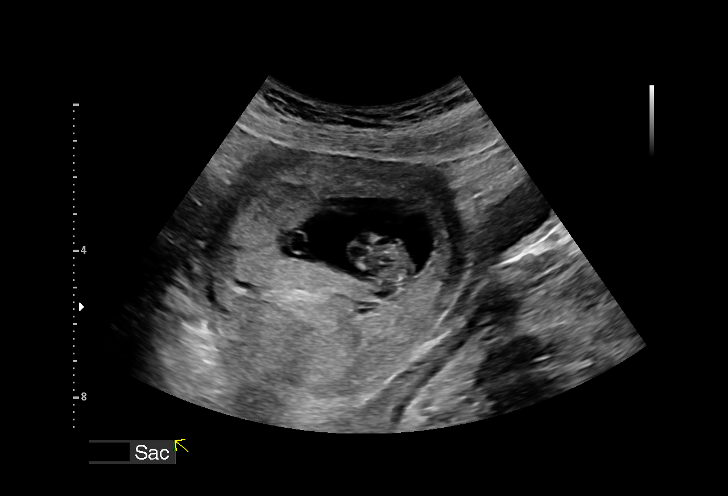

[15 of 28 positions shown; findings below may reference images not displayed]

FINDINGS: Intrauterine gestational sac: Single

Yolk sac:  Visualized

Embryo:  Visualized

Cardiac Activity: Visualized

Heart Rate: 171 bpm

MSD:    mm    w     d

CRL:   37.9 mm   10 w 4 d                  US EDC: 02/26/2019

Subchorionic hemorrhage:  Small subchorionic hemorrhage.

Maternal uterus/adnexae: No adnexal mass or free fluid.
IMPRESSION: Ten week 4 day intrauterine pregnancy. Fetal heart rate 171 beats
per minute. Small subchorionic hemorrhage.

## 2021-04-15 IMAGING — US US MFM FETAL NUCHAL TRANSLUCENCY
1 series · 14 of 28 positions shown · non-contrast
Comparison: none

[Series 1: us mfm fetal nuchal translucency · 14 of 32 slices shown]
[im 2/32]
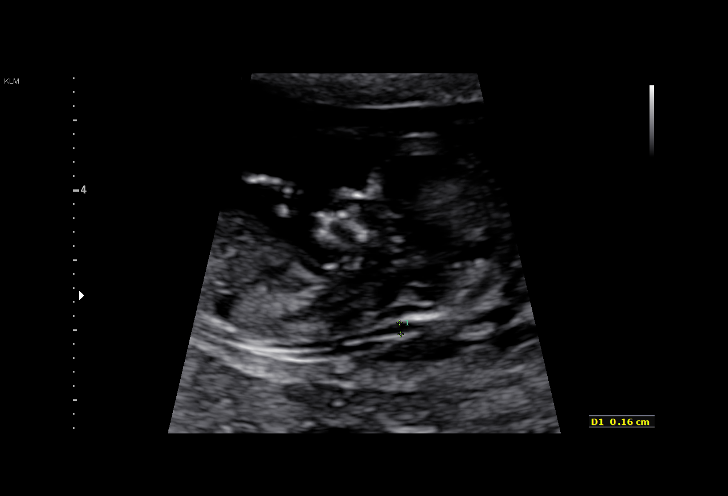
[im 4/32]
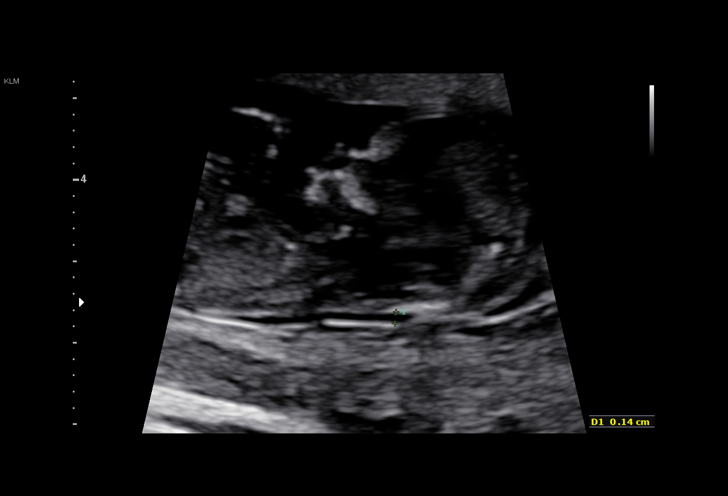
[im 6/32]
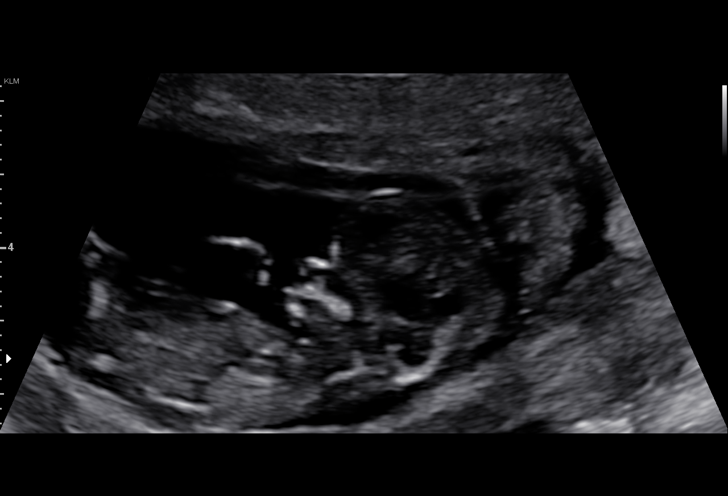
[im 9/32]
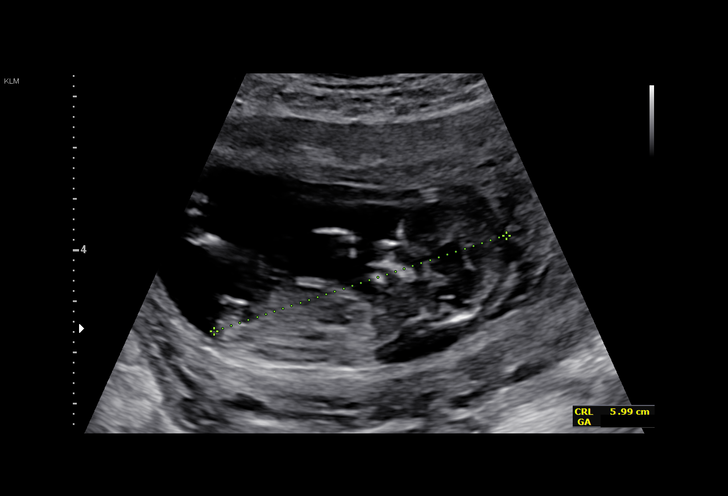
[im 11/32]
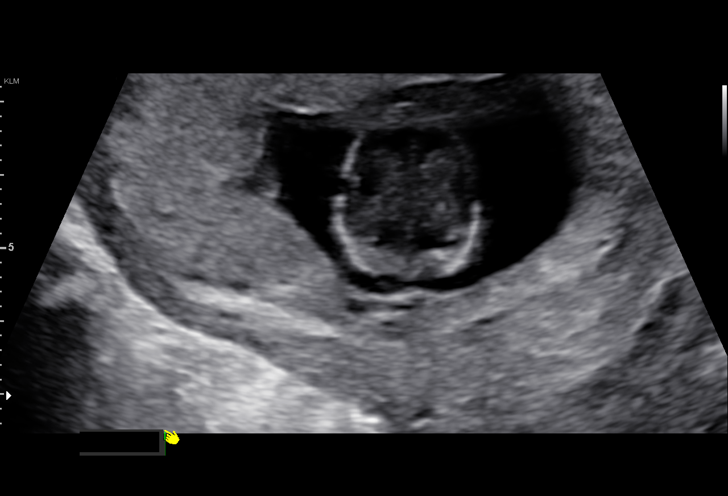
[im 13/32]
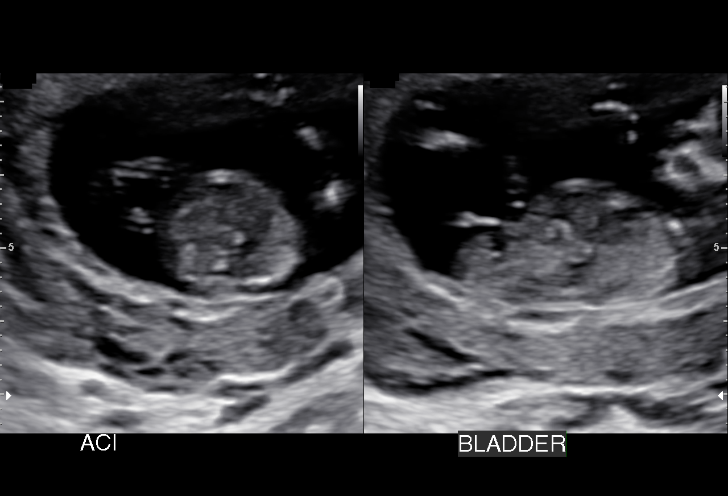
[im 15/32]
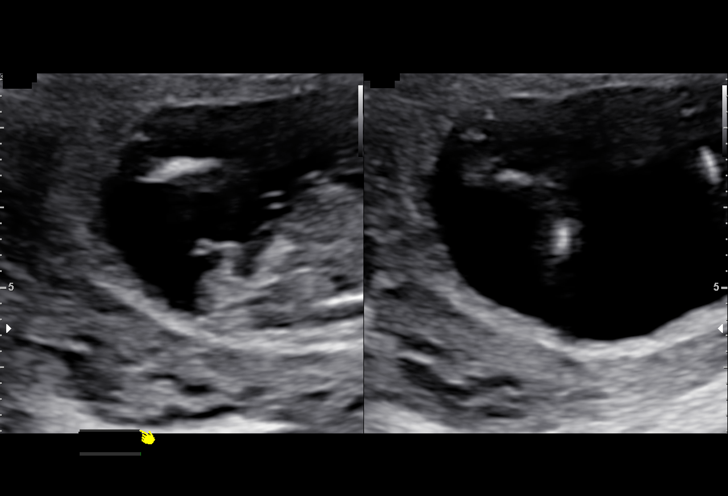
[im 18/32]
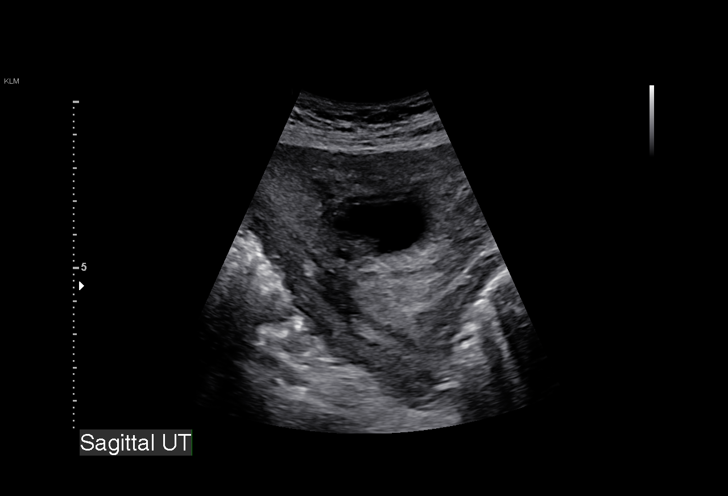
[im 20/32]
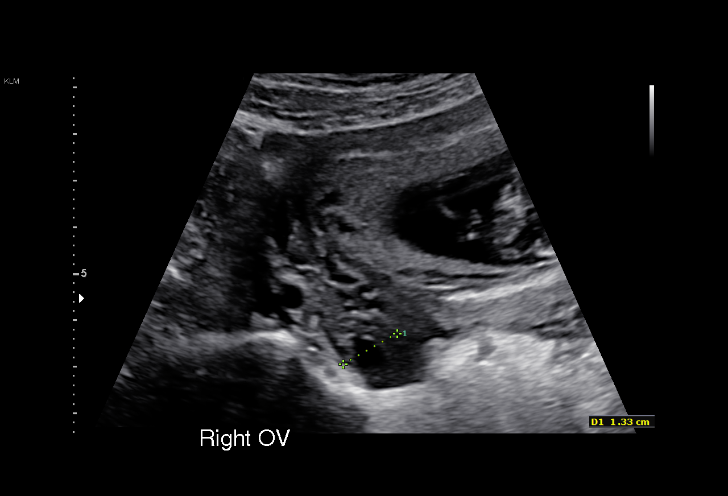
[im 22/32]
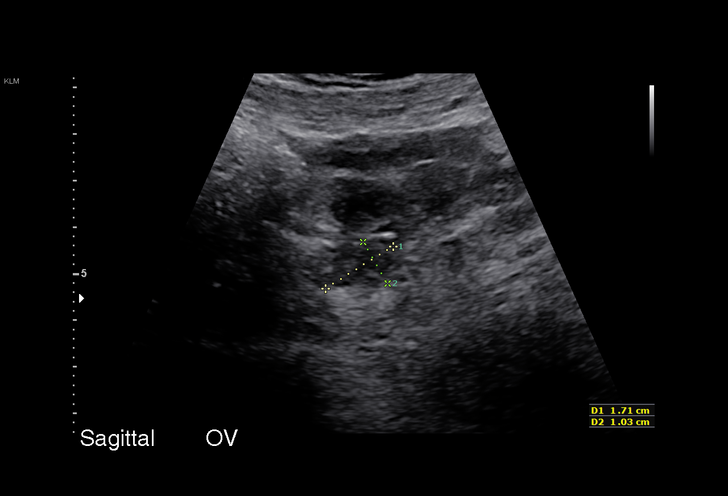
[im 25/32]
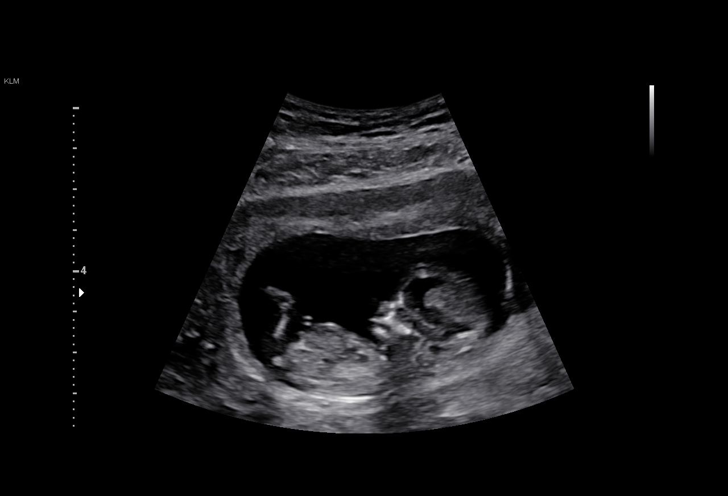
[im 27/32]
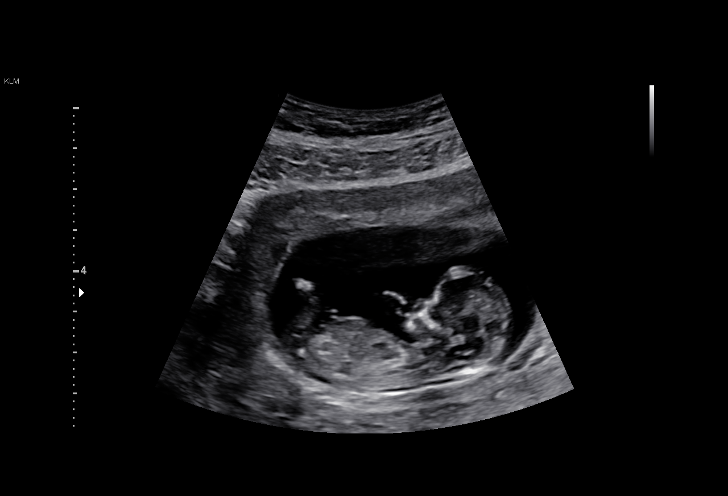
[im 29/32]
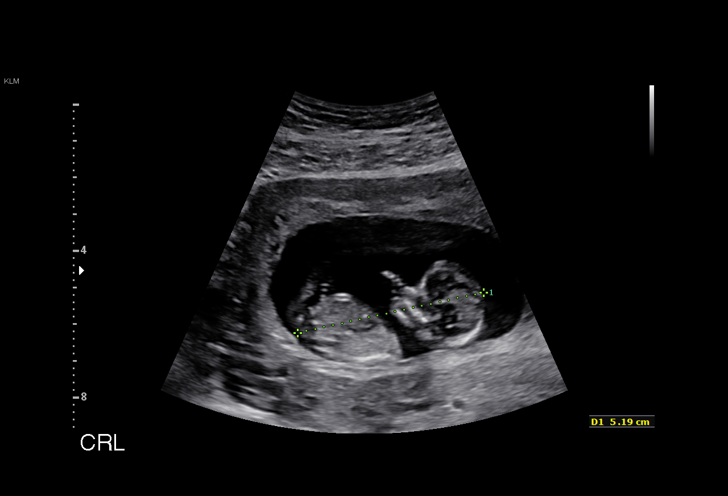
[im 32/32]
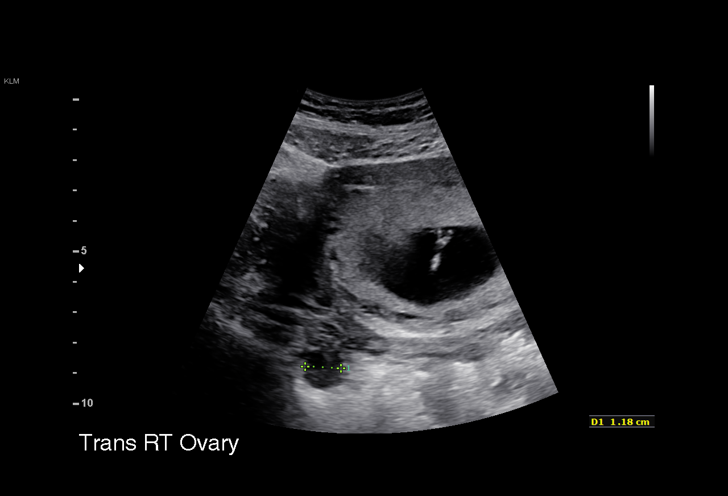

[14 of 28 positions shown; findings below may reference images not displayed]

BAJARANGI NP

     TRANSLUCENCY
 ----------------------------------------------------------------------

 ----------------------------------------------------------------------
Indications

  Encounter for nuchal translucency
  12 weeks gestation of pregnancy
 ----------------------------------------------------------------------
Fetal Evaluation

 Num Of Fetuses:         1
 Fetal Heart Rate(bpm):  150
 Cardiac Activity:       Observed
 Presentation:           Variable
 Placenta:               Fundal

 Amniotic Fluid
 AFI FV:      Within normal limits
Biometry

 CRL:      60.1  mm     G. Age:  12w 2d                  EDD:   02/26/19
OB History

 Gravidity:    3         Term:   1        Prem:   0        SAB:   1
 TOP:          0       Ectopic:  0        Living: 1
Gestational Age

 LMP:           14w 5d        Date:  05/05/18                 EDD:   02/09/19
 Best:          12w 2d     Det. By:  Early Ultrasound         EDD:   02/26/19
                                     (08/04/18)
1st Trimester Genetic Sonogram Screening

 CRL:            60.1  mm    G. Age:   12w 2d                 EDD:   02/26/19
 Nuc Trans:       1.6  mm
 Nasal Bone:                 Present
Anatomy

 Cranium:               Appears normal         Abdominal Wall:         Appears nml (cord
                                                                       insert, abd wall)
 Choroid Plexus:        Appears normal         Bladder:                Appears normal
 Stomach:               Appears normal, left
                        sided
Cervix Uterus Adnexa

 Cervix
 CLOSED
Impression

 On ultrasound, the CRL measurement is consistent with her
 previously-established dates and good fetal heart activity is
 seen. The nuchal translucency (NT) measures
 millimeters, which is normal.  Fetal anatomy that could be
 ascertained at this gestational age is normal.
 Blood was drawn today for first-trimester screening. We will
 communicate the results to the patient and fax a copy to your
 office.
Recommendations

 Fetal anatomy scan at 18-20 weeks.
                 Ti, Veturia
